# Patient Record
Sex: Female | Born: 1960 | Race: White | Hispanic: No | Smoking: Never smoker
Health system: Southern US, Community
[De-identification: ages and names within clinical notes are randomized; demographics above are authoritative.]

## PROBLEM LIST (undated history)

## (undated) DIAGNOSIS — T8859XA Other complications of anesthesia, initial encounter: Secondary | ICD-10-CM

## (undated) DIAGNOSIS — R011 Cardiac murmur, unspecified: Secondary | ICD-10-CM

## (undated) DIAGNOSIS — Z973 Presence of spectacles and contact lenses: Secondary | ICD-10-CM

## (undated) DIAGNOSIS — M1990 Unspecified osteoarthritis, unspecified site: Secondary | ICD-10-CM

## (undated) DIAGNOSIS — I341 Nonrheumatic mitral (valve) prolapse: Secondary | ICD-10-CM

## (undated) DIAGNOSIS — M419 Scoliosis, unspecified: Secondary | ICD-10-CM

## (undated) DIAGNOSIS — T4145XA Adverse effect of unspecified anesthetic, initial encounter: Secondary | ICD-10-CM

## (undated) HISTORY — PX: KNEE ARTHROSCOPY: SUR90

---

## 2006-07-19 ENCOUNTER — Emergency Department: Payer: Self-pay | Admitting: Emergency Medicine

## 2012-11-20 ENCOUNTER — Emergency Department: Payer: Self-pay | Admitting: Emergency Medicine

## 2012-12-21 ENCOUNTER — Ambulatory Visit: Payer: Self-pay | Admitting: Orthopedic Surgery

## 2014-12-26 ENCOUNTER — Encounter: Payer: Self-pay | Admitting: Emergency Medicine

## 2014-12-26 ENCOUNTER — Emergency Department: Payer: BLUE CROSS/BLUE SHIELD

## 2014-12-26 ENCOUNTER — Emergency Department
Admission: EM | Admit: 2014-12-26 | Discharge: 2014-12-26 | Disposition: A | Discharge status: Home / Self Care | Payer: BLUE CROSS/BLUE SHIELD | Attending: Emergency Medicine | Admitting: Emergency Medicine

## 2014-12-26 DIAGNOSIS — M25562 Pain in left knee: Secondary | ICD-10-CM | POA: Diagnosis present

## 2014-12-26 DIAGNOSIS — M25462 Effusion, left knee: Secondary | ICD-10-CM | POA: Diagnosis not present

## 2014-12-26 DIAGNOSIS — Z9889 Other specified postprocedural states: Secondary | ICD-10-CM | POA: Insufficient documentation

## 2014-12-26 MED ORDER — NAPROXEN 500 MG PO TABS: 500.0000 mg | ORAL_TABLET | Freq: Two times a day (BID) | ORAL | Status: DC

## 2014-12-26 MED ORDER — OXYCODONE-ACETAMINOPHEN 5-325 MG PO TABS: 1.0000 | ORAL_TABLET | Freq: Four times a day (QID) | ORAL | Status: DC | PRN

## 2014-12-26 NOTE — Discharge Instructions (Signed)

## 2014-12-26 NOTE — ED Provider Notes (Signed)
[facility] Emergency Department Provider Note  ____________________________________________  Time seen: Approximately 1844 PM  I have reviewed the triage vital signs and the nursing notes.   HISTORY  Chief Complaint Knee Pain    HPI Lisette AbuMichele T Grahn is a 54 y.o. female presents to emergency department for left knee pain after feeling a pop in the knee while running in the rain today. She denies falling she denies trauma. She reports that 10 years ago she had meniscus repair. She reports that pain in the knee is similar to that prior to her surgery.She states that initially she was able to walk however after 2 hours she was unable to bear weight.   History reviewed. No pertinent past medical history.  There are no active problems to display for this patient.   History reviewed. No pertinent past surgical history.  Current Outpatient Rx  Name  Route  Sig  Dispense  Refill  . naproxen (NAPROSYN) 500 MG tablet   Oral   Take 1 tablet (500 mg total) by mouth 2 (two) times daily with a meal.   60 tablet   0   . oxyCODONE-acetaminophen (ROXICET) 5-325 MG per tablet   Oral   Take 1 tablet by mouth every 6 (six) hours as needed.   9 tablet   0     Allergies Review of patient's allergies indicates no known allergies.  History reviewed. No pertinent family history.  Social History History  Substance Use Topics  . Smoking status: Never Smoker   . Smokeless tobacco: Not on file  . Alcohol Use: No    Review of Systems Constitutional: No fever/chills Cardiovascular: Denies chest pain. Respiratory: Denies shortness of breath. Gastrointestinal: No abdominal pain. Musculoskeletal: Negative for pain other than left knee. Skin: Negative for rash. Neurological: Negative for headaches, focal weakness or numbness.  10-point ROS otherwise negative.  ____________________________________________   PHYSICAL EXAM:  VITAL SIGNS: ED Triage Vitals   Enc Vitals Group     BP 12/26/14 1757 110/70 mmHg     Pulse Rate 12/26/14 1757 65     Resp 12/26/14 1757 21     Temp 12/26/14 1757 98.3 F (36.8 C)     Temp Source 12/26/14 1757 Oral     SpO2 12/26/14 1757 98 %     Weight 12/26/14 1757 170 lb (77.111 kg)     Height 12/26/14 1757 5\' 5"  (1.651 m)     Head Cir --      Peak Flow --      Pain Score 12/26/14 1758 4     Pain Loc --      Pain Edu? --      Excl. in GC? --     Constitutional: Alert and oriented. Well appearing and in no acute distress. Eyes: Conjunctivae are normal. PERRL. EOMI. Head: Atraumatic. Nose: No congestion/rhinnorhea. Mouth/Throat: Mucous membranes are moist.   Neck: No stridor.   Cardiovascular:   Good peripheral circulation. Respiratory: Normal respiratory effort.  No retractions.  Gastrointestinal: Soft and nontender. No distention. Musculoskeletal: Left Knee: No obvious deformity. Ligaments are stable. Pain localized outer posterior left knee. No crepitus on range of motion. Tracks midline. Neurologic:  Normal speech and language. No gross focal neurologic deficits are appreciated. Speech is normal. Skin:  Skin is warm, dry and intact. No rash noted. Psychiatric: Mood and affect are normal. Speech and behavior are normal.  ____________________________________________   LABS (all labs ordered are listed, but only abnormal results are displayed)  Labs Reviewed - No data to display ____________________________________________  EKG   ____________________________________________  RADIOLOGY  The effusion left knee. No bony abnormalities. ____________________________________________   PROCEDURES  Procedure(s) performed: knee immobilizer applied by Tech.   Critical Care performed: No  ____________________________________________   INITIAL IMPRESSION / ASSESSMENT AND PLAN / ED COURSE  Pertinent labs & imaging results that were available during my care of the patient were reviewed by me and  considered in my medical decision making (see chart for details).  Patient was advised to wear the knee immobilizer anytime she is up and moving around. She is able to bear weight with the immobilizer in place. She was advised to call and follow-up with the orthopedic doctor. ____________________________________________   FINAL CLINICAL IMPRESSION(S) / ED DIAGNOSES  Final diagnoses:  Knee joint effusion, left      Chinita PesterCari B Sudeep Scheibel, FNP 12/26/14 2302  Phineas SemenGraydon Goodman, MD 12/26/14 2328

## 2014-12-26 NOTE — ED Notes (Signed)
Patient to ED with c/o left knee pain, reports having surgery about 10 years ago. Patient reports that today she had to run to her car in the rain and felt pop and back of knee, now knee is clicking when she walks.

## 2015-01-12 ENCOUNTER — Other Ambulatory Visit: Payer: Self-pay | Admitting: Unknown Physician Specialty

## 2015-01-12 DIAGNOSIS — S83207A Unspecified tear of unspecified meniscus, current injury, left knee, initial encounter: Secondary | ICD-10-CM

## 2015-01-19 ENCOUNTER — Ambulatory Visit
Admission: RE | Admit: 2015-01-19 | Discharge: 2015-01-19 | Disposition: A | Discharge status: Home / Self Care | Payer: BLUE CROSS/BLUE SHIELD | Source: Ambulatory Visit | Attending: Unknown Physician Specialty | Admitting: Unknown Physician Specialty

## 2015-01-19 DIAGNOSIS — X58XXXA Exposure to other specified factors, initial encounter: Secondary | ICD-10-CM | POA: Insufficient documentation

## 2015-01-19 DIAGNOSIS — M1712 Unilateral primary osteoarthritis, left knee: Secondary | ICD-10-CM | POA: Insufficient documentation

## 2015-01-19 DIAGNOSIS — S83282A Other tear of lateral meniscus, current injury, left knee, initial encounter: Secondary | ICD-10-CM | POA: Insufficient documentation

## 2015-01-19 DIAGNOSIS — S83207A Unspecified tear of unspecified meniscus, current injury, left knee, initial encounter: Secondary | ICD-10-CM

## 2015-01-29 ENCOUNTER — Encounter: Payer: Self-pay | Admitting: *Deleted

## 2015-02-02 ENCOUNTER — Ambulatory Visit: Payer: BLUE CROSS/BLUE SHIELD | Admitting: Anesthesiology

## 2015-02-02 ENCOUNTER — Encounter
Admission: RE | Disposition: A | Discharge status: Home / Self Care | Payer: Self-pay | Source: Ambulatory Visit | Attending: Unknown Physician Specialty

## 2015-02-02 ENCOUNTER — Ambulatory Visit
Admission: RE | Admit: 2015-02-02 | Discharge: 2015-02-02 | Disposition: A | Discharge status: Home / Self Care | Payer: BLUE CROSS/BLUE SHIELD | Source: Ambulatory Visit | Attending: Unknown Physician Specialty | Admitting: Unknown Physician Specialty

## 2015-02-02 DIAGNOSIS — S83272A Complex tear of lateral meniscus, current injury, left knee, initial encounter: Secondary | ICD-10-CM | POA: Insufficient documentation

## 2015-02-02 DIAGNOSIS — Z79899 Other long term (current) drug therapy: Secondary | ICD-10-CM | POA: Insufficient documentation

## 2015-02-02 DIAGNOSIS — I341 Nonrheumatic mitral (valve) prolapse: Secondary | ICD-10-CM | POA: Diagnosis not present

## 2015-02-02 DIAGNOSIS — M25562 Pain in left knee: Secondary | ICD-10-CM | POA: Diagnosis present

## 2015-02-02 DIAGNOSIS — Z79891 Long term (current) use of opiate analgesic: Secondary | ICD-10-CM | POA: Diagnosis not present

## 2015-02-02 DIAGNOSIS — X58XXXA Exposure to other specified factors, initial encounter: Secondary | ICD-10-CM | POA: Insufficient documentation

## 2015-02-02 DIAGNOSIS — Y9302 Activity, running: Secondary | ICD-10-CM | POA: Diagnosis not present

## 2015-02-02 HISTORY — DX: Unspecified osteoarthritis, unspecified site: M19.90

## 2015-02-02 HISTORY — DX: Scoliosis, unspecified: M41.9

## 2015-02-02 HISTORY — DX: Adverse effect of unspecified anesthetic, initial encounter: T41.45XA

## 2015-02-02 HISTORY — DX: Other complications of anesthesia, initial encounter: T88.59XA

## 2015-02-02 HISTORY — DX: Presence of spectacles and contact lenses: Z97.3

## 2015-02-02 HISTORY — PX: KNEE ARTHROSCOPY: SHX127

## 2015-02-02 HISTORY — DX: Cardiac murmur, unspecified: R01.1

## 2015-02-02 SURGERY — ARTHROSCOPY, KNEE
Anesthesia: General | Laterality: Left | Wound class: Clean

## 2015-02-02 MED ORDER — LIDOCAINE HCL (CARDIAC) 20 MG/ML IV SOLN
INTRAVENOUS | Status: DC | PRN
  Administered 2015-02-02: 50 mg via INTRATRACHEAL

## 2015-02-02 MED ORDER — PROPOFOL 10 MG/ML IV BOLUS
INTRAVENOUS | Status: DC | PRN
  Administered 2015-02-02: 140 mg via INTRAVENOUS

## 2015-02-02 MED ORDER — ACETAMINOPHEN 160 MG/5ML PO SOLN: 325.0000 mg | ORAL | Status: DC | PRN

## 2015-02-02 MED ORDER — OXYCODONE HCL 5 MG/5ML PO SOLN: 5.0000 mg | Freq: Once | ORAL | Status: AC | PRN

## 2015-02-02 MED ORDER — BUPIVACAINE HCL (PF) 0.5 % IJ SOLN
INTRAMUSCULAR | Status: DC | PRN
  Administered 2015-02-02: 20 mL

## 2015-02-02 MED ORDER — OXYCODONE HCL 5 MG PO TABS
5.0000 mg | ORAL_TABLET | Freq: Once | ORAL | Status: AC | PRN
  Administered 2015-02-02: 5 mg via ORAL

## 2015-02-02 MED ORDER — ONDANSETRON HCL 4 MG/2ML IJ SOLN
INTRAMUSCULAR | Status: DC | PRN
  Administered 2015-02-02: 4 mg via INTRAVENOUS

## 2015-02-02 MED ORDER — LACTATED RINGERS IV SOLN
INTRAVENOUS | Status: DC
  Administered 2015-02-02: 07:00:00 via INTRAVENOUS

## 2015-02-02 MED ORDER — NORCO 5-325 MG PO TABS: 1.0000 | ORAL_TABLET | Freq: Four times a day (QID) | ORAL | Status: DC | PRN

## 2015-02-02 MED ORDER — MIDAZOLAM HCL 5 MG/5ML IJ SOLN
INTRAMUSCULAR | Status: DC | PRN
  Administered 2015-02-02: 2 mg via INTRAVENOUS

## 2015-02-02 MED ORDER — GLYCOPYRROLATE 0.2 MG/ML IJ SOLN
INTRAMUSCULAR | Status: DC | PRN
  Administered 2015-02-02: 0.1 mg via INTRAVENOUS

## 2015-02-02 MED ORDER — FENTANYL CITRATE (PF) 100 MCG/2ML IJ SOLN
INTRAMUSCULAR | Status: DC | PRN
  Administered 2015-02-02 (×2): 50 ug via INTRAVENOUS

## 2015-02-02 MED ORDER — ONDANSETRON HCL 4 MG/2ML IJ SOLN: 4.0000 mg | Freq: Once | INTRAMUSCULAR | Status: DC | PRN

## 2015-02-02 MED ORDER — LACTATED RINGERS IR SOLN
Status: DC | PRN
  Administered 2015-02-02: 2000 mL

## 2015-02-02 MED ORDER — HYDROMORPHONE HCL 1 MG/ML IJ SOLN: 0.2500 mg | INTRAMUSCULAR | Status: DC | PRN

## 2015-02-02 MED ORDER — ACETAMINOPHEN 325 MG PO TABS: 325.0000 mg | ORAL_TABLET | ORAL | Status: DC | PRN

## 2015-02-02 MED ORDER — DEXAMETHASONE SODIUM PHOSPHATE 4 MG/ML IJ SOLN
INTRAMUSCULAR | Status: DC | PRN
  Administered 2015-02-02: 4 mg via INTRAVENOUS

## 2015-02-02 SURGICAL SUPPLY — 39 items
ARTHROWAND PARAGON T2 (SURGICAL WAND)
BUR RADIUS 3.5 (BURR) ×2 IMPLANT
BUR RADIUS 4.0X18.5 (BURR) IMPLANT
BUR ROUND 5.5 (BURR) IMPLANT
BURR ROUND 12 FLUTE 4.0MM (BURR) ×2 IMPLANT
CUFF TOURN SGL QUICK 24 (TOURNIQUET CUFF) ×1
CUFF TOURN SGL QUICK 30 (MISCELLANEOUS)
CUFF TOURN SGL QUICK 34 (TOURNIQUET CUFF)
CUFF TRNQT CYL 24X4X40X1 (TOURNIQUET CUFF) ×1 IMPLANT
CUFF TRNQT CYL 34X4X40X1 (TOURNIQUET CUFF) IMPLANT
CUFF TRNQT CYL LO 30X4X (MISCELLANEOUS) IMPLANT
CUTTER SLOTTED WHISKER 4.0 (BURR) IMPLANT
DRAPE LEGGINS SURG 28X43 STRL (DRAPES) ×2 IMPLANT
DURAPREP 26ML APPLICATOR (WOUND CARE) ×2 IMPLANT
GAUZE PETROLATUM 1 X8 (GAUZE/BANDAGES/DRESSINGS) ×2 IMPLANT
GAUZE SPONGE 4X4 12PLY STRL (GAUZE/BANDAGES/DRESSINGS) ×2 IMPLANT
GLOVE BIO SURGEON STRL SZ7.5 (GLOVE) ×2 IMPLANT
GLOVE BIO SURGEON STRL SZ8 (GLOVE) ×2 IMPLANT
GLOVE INDICATOR 8.0 STRL GRN (GLOVE) ×2 IMPLANT
GOWN STRL REIN 2XL XLG LVL4 (GOWN DISPOSABLE) ×2 IMPLANT
GOWN STRL REUS W/TWL 2XL LVL3 (GOWN DISPOSABLE) ×2 IMPLANT
IV LACTATED RINGER IRRG 3000ML (IV SOLUTION) ×2
IV LR IRRIG 3000ML ARTHROMATIC (IV SOLUTION) ×2 IMPLANT
MANIFOLD 4PT FOR NEPTUNE1 (MISCELLANEOUS) ×2 IMPLANT
PACK ARTHROSCOPY KNEE (MISCELLANEOUS) ×2 IMPLANT
PAD CAST 4YDX4 CTTN HI CHSV (CAST SUPPLIES) ×1 IMPLANT
PADDING CAST COTTON 4X4 STRL (CAST SUPPLIES) ×1
SET TUBE SUCT SHAVER OUTFL 24K (TUBING) ×2 IMPLANT
SUT ETHILON 3-0 FS-10 30 BLK (SUTURE) ×2
SUTURE EHLN 3-0 FS-10 30 BLK (SUTURE) ×1 IMPLANT
TAPE CLOTH SOFT 2X10 (GAUZE/BANDAGES/DRESSINGS) ×2 IMPLANT
TAPE MICROFOAM 4IN (TAPE) ×2 IMPLANT
TUBING ARTHRO INFLOW-ONLY STRL (TUBING) ×2 IMPLANT
WAND ARTHRO PARAGON T2 (SURGICAL WAND) IMPLANT
WAND COVAC 50 IFS (MISCELLANEOUS) IMPLANT
WAND HAND CNTRL MULTIVAC 50 (MISCELLANEOUS) ×2 IMPLANT
WAND HAND CNTRL MULTIVAC 90 (MISCELLANEOUS) IMPLANT
WAND MEGAVAC 90 (MISCELLANEOUS) IMPLANT
WAND ULTRAVAC 90 (MISCELLANEOUS) IMPLANT

## 2015-02-02 NOTE — Op Note (Signed)
Preoperative diagnosis: Torn lateral meniscus left knee  Postop diagnosis: Same  Operation: Arthroscopic partial lateral meniscectomy on the left  Surgeon: Randon Goldsmith, MD  Anesthesia: Gen.   History: Patient's had a long history of left knee pain.  Her plain films revealed no significant degenerative changes . She had an MRI which revealed a torn posterior horn of the lateral meniscus along with some mild  chondral changes.The patient was scheduled for surgery due to persistent discomfort despite conservative treatment.  The patient was taken the operating room where satisfactory general anesthesia was achieved. A tourniquet and leg holder were was applied to the left thigh. The left lower extremity was supported with a well leg holder. The left knee was prepped and draped in usual fashion for an arthroscopic procedure. An inflow cannula was introduced superomedially. The joint was distended with lactated Ringer's. Scope was introduced through an inferolateral puncture wound and a probe through an inferomedial puncture wound. Inspection of the medial compartment revealed  no meniscal tear. There was mild fibrillation of the anterior weightbearing portion of the medial femoral articular cartilage. Inspection of the intercondylar notch revealed intact cruciates. Inspection of the the lateral compartment revealed a complex tear of the posterior horn of the lateral meniscus. There was also some mild fibrillation of the posterior aspect of the central weightbearing portion of the lateral femoral condyle. I went ahead and resected the posterior horn tear using a motorized resector. The remaining rim was contoured with an angled ArthroCare wand.  Trochlear groove was inspected and appeared to be fairly smooth.  Patella surface was normal. I observed patella tracking. The the patella seemed to track fairly well.  The instruments were removed from the joint at this time. The puncture wounds were  closed with 3-0 nylon in vertical mattress fashion. I injected each puncture wound with several cc of half percent Marcaine without epinephrine. Betadine was applied the wounds followed by sterile dressing. An ice pack was applied to the right knee. The patient was awakened and transferred to her stretcher bed. She was taken to the recovery room in satisfactory condition.  The tourniquet was not inflated during the course of the procedure. Blood loss was negligible.

## 2015-02-02 NOTE — Anesthesia Preprocedure Evaluation (Signed)
Anesthesia Evaluation  Patient identified by MRN, date of birth, ID band Patient awake    Reviewed: Allergy & Precautions, H&P , NPO status , Patient's Chart, lab work & pertinent test results, reviewed documented beta blocker date and time   History of Anesthesia Complications Negative for: history of anesthetic complications  Airway Mallampati: II  TM Distance: >3 FB Neck ROM: full    Dental no notable dental hx.    Pulmonary neg pulmonary ROS,  breath sounds clear to auscultation  Pulmonary exam normal       Cardiovascular Exercise Tolerance: Good + Valvular Problems/Murmurs MVP Rhythm:regular Rate:Normal     Neuro/Psych negative neurological ROS  negative psych ROS   GI/Hepatic negative GI ROS, Neg liver ROS,   Endo/Other  negative endocrine ROS  Renal/GU negative Renal ROS  negative genitourinary   Musculoskeletal   Abdominal   Peds  Hematology negative hematology ROS (+)   Anesthesia Other Findings   Reproductive/Obstetrics negative OB ROS                             Anesthesia Physical Anesthesia Plan  ASA: I  Anesthesia Plan: General LMA   Post-op Pain Management:    Induction:   Airway Management Planned:   Additional Equipment:   Intra-op Plan:   Post-operative Plan:   Informed Consent: I have reviewed the patients History and Physical, chart, labs and discussed the procedure including the risks, benefits and alternatives for the proposed anesthesia with the patient or authorized representative who has indicated his/her understanding and acceptance.     Plan Discussed with: CRNA  Anesthesia Plan Comments:         Anesthesia Quick Evaluation

## 2015-02-02 NOTE — Transfer of Care (Signed)
Immediate Anesthesia Transfer of Care Note  Patient: Karen Pacheco  Procedure(s) Performed: Procedure(s): ARTHROSCOPY KNEE (Left)  Patient Location: PACU  Anesthesia Type: General LMA  Level of Consciousness: awake, alert  and patient cooperative  Airway and Oxygen Therapy: Patient Spontanous Breathing and Patient connected to supplemental oxygen  Post-op Assessment: Post-op Vital signs reviewed, Patient's Cardiovascular Status Stable, Respiratory Function Stable, Patent Airway and No signs of Nausea or vomiting  Post-op Vital Signs: Reviewed and stable  Complications: No apparent anesthesia complications

## 2015-02-02 NOTE — Transfer of Care (Signed)
Immediate Anesthesia Transfer of Care Note  Patient: Karen Pacheco  Procedure(s) Performed: Procedure(s): ARTHROSCOPY KNEE (Left)  Patient Location: PACU  Anesthesia Type: General LMA  Level of Consciousness: awake, alert  and patient cooperative  Airway and Oxygen Therapy: Patient Spontanous Breathing and Patient connected to supplemental oxygen  Post-op Assessment: Post-op Vital signs reviewed, Patient's Cardiovascular Status Stable, Respiratory Function Stable, Patent Airway and No signs of Nausea or vomiting  Post-op Vital Signs: Reviewed and stable  Complications: No apparent anesthesia complications  

## 2015-02-02 NOTE — Anesthesia Postprocedure Evaluation (Signed)
  Anesthesia Post-op Note  Patient: Karen Pacheco  Procedure(s) Performed: Procedure(s): ARTHROSCOPY KNEE (Left)  Anesthesia type:General LMA  Patient location: PACU  Post pain: Pain level controlled  Post assessment: Post-op Vital signs reviewed, Patient's Cardiovascular Status Stable, Respiratory Function Stable, Patent Airway and No signs of Nausea or vomiting  Post vital signs: Reviewed and stable  Last Vitals:  Filed Vitals:   02/02/15 0900  BP: 118/69  Pulse: 60  Temp:   Resp: 11    Level of consciousness: awake, alert  and patient cooperative  Complications: No apparent anesthesia complications

## 2015-02-02 NOTE — H&P (Signed)
  H and P reviewed. No changes. Uploaded at later date. 

## 2015-02-02 NOTE — Anesthesia Procedure Notes (Signed)
Procedure Name: LMA Insertion Date/Time: 02/02/2015 7:39 AM Performed by: Jimmy Picket Pre-anesthesia Checklist: Patient identified, Emergency Drugs available, Suction available, Timeout performed and Patient being monitored Patient Re-evaluated:Patient Re-evaluated prior to inductionOxygen Delivery Method: Circle system utilized Preoxygenation: Pre-oxygenation with 100% oxygen Intubation Type: IV induction LMA: LMA inserted LMA Size: 4.0 Number of attempts: 1 Placement Confirmation: positive ETCO2 and breath sounds checked- equal and bilateral Tube secured with: Tape

## 2015-02-02 NOTE — Discharge Instructions (Signed)
General Anesthesia, Care After Refer to this sheet in the next few weeks. These instructions provide you with information on caring for yourself after your procedure. Your health care provider may also give you more specific instructions. Your treatment has been planned according to current medical practices, but problems sometimes occur. Call your health care provider if you have any problems or questions after your procedure. WHAT TO EXPECT AFTER THE PROCEDURE After the procedure, it is typical to experience:  Sleepiness.  Nausea and vomiting. HOME CARE INSTRUCTIONS  For the first 24 hours after general anesthesia:  Have a responsible person with you.  Do not drive a car. If you are alone, do not take public transportation.  Do not drink alcohol.  Do not take medicine that has not been prescribed by your health care provider.  Do not sign important papers or make important decisions.  You may resume a normal diet and activities as directed by your health care provider.  Change bandages (dressings) as directed.  If you have questions or problems that seem related to general anesthesia, call the hospital and ask for the anesthetist or anesthesiologist on call. SEEK MEDICAL CARE IF:  You have nausea and vomiting that continue the day after anesthesia.  You develop a rash. SEEK IMMEDIATE MEDICAL CARE IF:   You have difficulty breathing.  You have chest pain.  You have any allergic problems. Document Released: 11/03/2000 Document Revised: 08/02/2013 Document Reviewed: 02/10/2013 [facility] Patient Information 2015 [place], [place]. This information is not intended to replace advice given to you by your health care provider. Make sure you discuss any questions you have with your health care provider.   KNEE ARTHROSCOPY POST OPERATION INSTRUCTIONS:  PLEASE READ THESE INSTRUCTIONS ABOUT POST OPERATION CARE. THEY WILL ANSWER MOST OF YOUR QUESTIONS.  You have been given a  prescription for pain. Please take as directed for pain.  You can walk, keeping the knee slightly stiff-avoid doing too much bending the first day. (if ACL reconstruction is performed, keep brace locked in extension when walking.)  You will use crutches or cane if needed. Can weight bear as tolerated  Plan to take three to four days off from work. You can resume work when you are comfortable. (This can be a week or more, depending on the type of work you do.)  To reduce pain and swelling, place one to two pillows under the knee the first two or three days when sitting or lying. An ice pack may be placed on top of the area over the dressing. Instructions for making homemade icepack are as follow:  Flexible homemade alcohol water ice pack  2 cups water  1 cup rubbing alcohol  food coloring for the blue tint (optional)  2 zip-top bags - gallon-size  Mix the water and alcohol together in one of your zip-top bags and add food coloring. Release as much air as possible and seal the bag. Place in freezer for at least 12 hours.  The small incisions in your knee are closed with nylon stitches. They will be removed in the office.  The bulky dressing may be removed in the third day after surgery. (If ACL surgery-DO NOT REMOVE BANDAGES). Put a waterproof band-aid over each stitch. Do not put any creams or ointments on wounds. You may shower at this time, but change waterproof band-aids after showering. KEEP INCISIONS CLEAN AND DRY UNTIL YOU RETURN TO THE OFFICE.  Sometimes the operative area remains somewhat painful and swollen for several weeks.  This is usually nothing to worry about, but call if you have any excessive symptoms, especially fever. It is not unusual to have a low grade fever of 99 degrees for the first few days. If persist after 3-4 days call the office. It is not uncommon for the pain to be a little worse on the third day after surgery.  Begin doing gentle exercises right away. They will be limited  by the amount of pain and swelling you have.  Exercising will reduce the swelling, increase motion, and prevent muscle weakness. Exercises: Straight leg raising and gentle knee bending.  Take 81 milligram aspirin twice a day for 2 weeks after meals or milk. This along with elevation will help reduce the possibility of phlebitis in your operated leg.  Avoid strenuous athletics for a minimum of 4 to 6 weeks after arthroscopic surgery (approximately five months if ACL surgery).  If the surgery included ACL reconstruction the brace that is supplied to the extremity post surgery is to be locked in extension when you are asleep and is to be locked in extension when you are ambulating. It can be unlocked for exercises or sitting.  Keep your post surgery appointment that has been made for you. If you do not remember the date call [phone]. Your follow up appointment should be between 7-10 days.

## 2015-02-05 ENCOUNTER — Encounter: Payer: Self-pay | Admitting: Unknown Physician Specialty

## 2015-08-07 LAB — HM PAP SMEAR: HM Pap smear: NORMAL

## 2015-12-18 ENCOUNTER — Encounter: Payer: Self-pay | Admitting: Emergency Medicine

## 2015-12-18 ENCOUNTER — Emergency Department
Admission: EM | Admit: 2015-12-18 | Discharge: 2015-12-18 | Disposition: A | Discharge status: Home / Self Care | Payer: BLUE CROSS/BLUE SHIELD | Attending: Emergency Medicine | Admitting: Emergency Medicine

## 2015-12-18 DIAGNOSIS — Z8679 Personal history of other diseases of the circulatory system: Secondary | ICD-10-CM | POA: Insufficient documentation

## 2015-12-18 DIAGNOSIS — M1990 Unspecified osteoarthritis, unspecified site: Secondary | ICD-10-CM | POA: Diagnosis not present

## 2015-12-18 DIAGNOSIS — F419 Anxiety disorder, unspecified: Secondary | ICD-10-CM | POA: Insufficient documentation

## 2015-12-18 DIAGNOSIS — R0789 Other chest pain: Secondary | ICD-10-CM | POA: Diagnosis present

## 2015-12-18 DIAGNOSIS — Z791 Long term (current) use of non-steroidal anti-inflammatories (NSAID): Secondary | ICD-10-CM | POA: Diagnosis not present

## 2015-12-18 HISTORY — DX: Nonrheumatic mitral (valve) prolapse: I34.1

## 2015-12-18 LAB — CBC
HCT: 39.7 % (ref 35.0–47.0)
Hemoglobin: 13.4 g/dL (ref 12.0–16.0)
MCH: 30.5 pg (ref 26.0–34.0)
MCHC: 33.7 g/dL (ref 32.0–36.0)
MCV: 90.5 fL (ref 80.0–100.0)
Platelets: 228 10*3/uL (ref 150–440)
RBC: 4.39 MIL/uL (ref 3.80–5.20)
RDW: 13.9 % (ref 11.5–14.5)
WBC: 5.9 10*3/uL (ref 3.6–11.0)

## 2015-12-18 LAB — COMPREHENSIVE METABOLIC PANEL
ALT: 17 U/L (ref 14–54)
AST: 16 U/L (ref 15–41)
Albumin: 4.3 g/dL (ref 3.5–5.0)
Alkaline Phosphatase: 104 U/L (ref 38–126)
Anion gap: 8 (ref 5–15)
BUN: 15 mg/dL (ref 6–20)
CO2: 26 mmol/L (ref 22–32)
Calcium: 9 mg/dL (ref 8.9–10.3)
Chloride: 110 mmol/L (ref 101–111)
Creatinine, Ser: 0.81 mg/dL (ref 0.44–1.00)
GFR calc Af Amer: >60 mL/min (ref >60)
GFR calc non Af Amer: >60 mL/min (ref >60)
Glucose, Bld: 105 mg/dL — ABNORMAL HIGH (ref 65–99)
Potassium: 3.4 mmol/L — ABNORMAL LOW (ref 3.5–5.1)
Sodium: 144 mmol/L (ref 135–145)
Total Bilirubin: 0.3 mg/dL (ref 0.3–1.2)
Total Protein: 6.8 g/dL (ref 6.5–8.1)

## 2015-12-18 LAB — TROPONIN I: Troponin I: <0.03 ng/mL (ref <0.031)

## 2015-12-18 MED ORDER — HYDROXYZINE PAMOATE 25 MG PO CAPS: 25.0000 mg | ORAL_CAPSULE | Freq: Every evening | ORAL | Status: DC | PRN

## 2015-12-18 NOTE — ED Provider Notes (Signed)
[facility] Emergency Department Provider Note ____________________________________________  Time seen: Approximately 9:37 PM  I have reviewed the triage vital signs and the nursing notes.   HISTORY  Chief Complaint Chest Pain   HPI Karen Pacheco is a 55 y.o. female complaining of substernal chest pain x 1 day. Patient states it began upon waking and is a continuous discomfort that "feels like an elephant on my chest." Denies any numbness or tingling in any extremities, HA, SOB, DOE, cough, nausea, or weakness. 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 She notes that she feels so overwhelmed she cannot sleep most nights. Her husband is currently admitted at [facility] following triple bypass surgery as well.    Past Medical History  Diagnosis Date  . Complication of anesthesia     After knee surgery, felt like she couldn't breathe, SATS were fine.  Marland Kitchen Heart murmur     told in past she has MVP  . Scoliosis     mild  . Arthritis     knees  . Wears contact lenses   . Mitral valve prolapse     There are no active problems to display for this patient.   Past Surgical History  Procedure Laterality Date  . Cesarean section    . Knee arthroscopy    . Knee arthroscopy Left 02/02/2015    Procedure: ARTHROSCOPY KNEE;  Surgeon: Erin Sons, MD;  [facility];  Service: Orthopedics;  Laterality: Left;    Current Outpatient Rx  Name  Route  Sig  Dispense  Refill  . CALCIUM PO   Oral   Take by mouth.         . hydrOXYzine (VISTARIL) 25 MG capsule   Oral   Take 1 capsule (25 mg total) by mouth at bedtime as needed for anxiety.   30 capsule   0   . Multiple Vitamins-Minerals (HAIR/SKIN/NAILS PO)   Oral   Take by mouth.         . Multiple Vitamins-Minerals (MULTIVITAMIN WITH MINERALS) tablet   Oral    Take 1 tablet by mouth daily.         . naproxen (NAPROSYN) 500 MG tablet   Oral   Take 1 tablet (500 mg total) by mouth 2 (two) times daily with a meal.   60 tablet   0   . NORCO 5-325 MG per tablet   Oral   Take 1-2 tablets by mouth every 6 (six) hours as needed for moderate pain. MAXIMUM TOTAL ACETAMINOPHEN DOSE IS 4000 MG PER DAY   25 tablet   0     Dispense as written.   Marland Kitchen oxyCODONE-acetaminophen (ROXICET) 5-325 MG per tablet   Oral   Take 1 tablet by mouth every 6 (six) hours as needed.   9 tablet   0     Allergies Review of patient's allergies indicates no known allergies.  No family history on file.  Social History Social History  Substance Use Topics  . Smoking status: Never Smoker   . Smokeless tobacco: None  . Alcohol Use: No    Review of Systems Constitutional: No fever/chills Eyes: No visual changes. Cardiovascular: Admits to substernal chest pain. Respiratory: Denies shortness of breath, DOE, cough Gastrointestinal: No abdominal pain.  No nausea, no vomiting.  No diarrhea.  No constipation. Musculoskeletal: Negative for back pain, or  pain in any extremities Skin: Negative for rash. Neurological: Negative for headaches, focal weakness or numbness.  10-point ROS otherwise negative.  ____________________________________________   PHYSICAL EXAM:  VITAL SIGNS: ED Triage Vitals  Enc Vitals Group     BP 12/18/15 1906 138/82 mmHg     Pulse Rate 12/18/15 1906 65     Resp 12/18/15 1906 18     Temp 12/18/15 1906 98.2 F (36.8 C)     Temp Source 12/18/15 1906 Oral     SpO2 12/18/15 1906 100 %     Weight 12/18/15 1906 165 lb (74.844 kg)     Height 12/18/15 1906 5\' 5"  (1.651 m)     Head Cir --      Peak Flow --      Pain Score 12/18/15 1907 4     Pain Loc --      Pain Edu? --      Excl. in GC? --     Constitutional: Alert and oriented. Well appearing and in no acute distress. Eyes: Conjunctivae are normal. PERRL. EOMI. Head:  Atraumatic. Nose: No congestion/rhinnorhea. Neck: No stridor. Cardiovascular: Normal rate, regular rhythm. Grossly normal heart sounds.  Good peripheral circulation. Normal cap refill Respiratory: Normal respiratory effort.  No retractions. Lungs CTAB. Musculoskeletal: No lower extremity tenderness nor edema.  No joint effusions. Neurologic:  Normal speech and language. No gross focal neurologic deficits are appreciated. No gait instability. Skin:  Skin is warm, dry and intact. No rash noted. Psychiatric: Mood and affect are normal. Speech and behavior are normal.  ____________________________________________   LABS (all labs ordered are listed, but only abnormal results are displayed)  Labs Reviewed  COMPREHENSIVE METABOLIC PANEL - Abnormal; Notable for the following:    Potassium 3.4 (*)    Glucose, Bld 105 (*)    All other components within normal limits  CBC  TROPONIN I   ____________________________________________  EKG  Normal Sinus Rhythm  PROCEDURES  Procedure(s) performed: None  Critical Care performed: No  ____________________________________________   INITIAL IMPRESSION / ASSESSMENT AND PLAN / ED COURSE  Pertinent labs & imaging results that were available during my care of the patient were reviewed by me and considered in my medical decision making (see chart for details).  55 yo female complaining of substernal chest pain x 1 day. Diagnosis consistent with situational anxiety. Patient advised to follow-up with psychiatry or establish PCP should symptoms continue or worsen. Discharged home with hydroxyzine 25mg  take 1 capsule QHS.  ____________________________________________   FINAL CLINICAL IMPRESSION(S) / ED DIAGNOSES  Final diagnoses:  Anxiety     This chart was dictated using voice recognition software/Dragon. Despite best efforts to proofread, errors can occur which can change the meaning. Any change was purely unintentional.  Evangeline Dakinharles M Beers,  PA-C 12/18/15 2256  Phineas SemenGraydon Goodman, MD 12/18/15 2300

## 2015-12-18 NOTE — Discharge Instructions (Signed)
Generalized Anxiety Disorder Generalized anxiety disorder (GAD) is a mental disorder. It interferes with life functions, including relationships, work, and school. GAD is different from normal anxiety, which everyone experiences at some point in their lives in response to specific life events and activities. Normal anxiety actually helps us prepare for and get through these life events and activities. Normal anxiety goes away after the event or activity is over.  GAD causes anxiety that is not necessarily related to specific events or activities. It also causes excess anxiety in proportion to specific events or activities. The anxiety associated with GAD is also difficult to control. GAD can vary from mild to severe. People with severe GAD can have intense waves of anxiety with physical symptoms (panic attacks).  SYMPTOMS The anxiety and worry associated with GAD are difficult to control. This anxiety and worry are related to many life events and activities and also occur more days than not for 6 months or longer. People with GAD also have three or more of the following symptoms (one or more in children):  Restlessness.   Fatigue.  Difficulty concentrating.   Irritability.  Muscle tension.  Difficulty sleeping or unsatisfying sleep. DIAGNOSIS GAD is diagnosed through an assessment by your health care provider. Your health care provider will ask you questions aboutyour mood,physical symptoms, and events in your life. Your health care provider may ask you about your medical history and use of alcohol or drugs, including prescription medicines. Your health care provider may also do a physical exam and blood tests. Certain medical conditions and the use of certain substances can cause symptoms similar to those associated with GAD. Your health care provider may refer you to a mental health specialist for further evaluation. TREATMENT The following therapies are usually used to treat GAD:    Medication. Antidepressant medication usually is prescribed for long-term daily control. Antianxiety medicines may be added in severe cases, especially when panic attacks occur.   Talk therapy (psychotherapy). Certain types of talk therapy can be helpful in treating GAD by providing support, education, and guidance. A form of talk therapy called cognitive behavioral therapy can teach you healthy ways to think about and react to daily life events and activities.  Stress managementtechniques. These include yoga, meditation, and exercise and can be very helpful when they are practiced regularly. A mental health specialist can help determine which treatment is best for you. Some people see improvement with one therapy. However, other people require a combination of therapies.   This information is not intended to replace advice given to you by your health care provider. Make sure you discuss any questions you have with your health care provider.   Document Released: 11/22/2012 Document Revised: 08/18/2014 Document Reviewed: 11/22/2012 Elsevier Interactive Patient Education 2016 Elsevier Inc.  

## 2015-12-18 NOTE — ED Notes (Addendum)
Pt in with co midsternal chest pressure since today, denies any shob, or nausea.  No recent illness and has hx of mitral valve prolapse. Pt states she has been under a lot of stress at work.

## 2017-03-25 ENCOUNTER — Encounter: Payer: Self-pay | Admitting: Obstetrics and Gynecology

## 2017-03-25 ENCOUNTER — Ambulatory Visit (INDEPENDENT_AMBULATORY_CARE_PROVIDER_SITE_OTHER): Payer: BLUE CROSS/BLUE SHIELD | Admitting: Obstetrics and Gynecology

## 2017-03-25 ENCOUNTER — Ambulatory Visit: Payer: Self-pay | Admitting: Obstetrics & Gynecology

## 2017-03-25 DIAGNOSIS — B356 Tinea cruris: Secondary | ICD-10-CM

## 2017-03-25 MED ORDER — CLOTRIMAZOLE-BETAMETHASONE 1-0.05 % EX CREA: 1.0000 "application " | TOPICAL_CREAM | Freq: Two times a day (BID) | CUTANEOUS | 0 refills | Status: DC

## 2017-03-25 NOTE — Progress Notes (Signed)
Chief Complaint  Patient presents with  . Rash    vaginal    HPI:      Ms. Karen Pacheco is a 56 y.o. [id number] who LMP was No LMP recorded. Patient is postmenopausal., presents today for a vaginal rash for the past 3 wks. Rash is mildly itchy and uncomfortable with wiping. She has tried vaseline and cortisone-10 without relief. The rash is spreading now too. Pt is is damp underwear regularly due to being outside in the heat. She changes frequently and uses cornstarch to stay dry. No new soaps/detergents. No vaginal d/c, odor.  Past Medical History:  Diagnosis Date  . Arthritis    knees  . Complication of anesthesia    After knee surgery, felt like she couldn't breathe, SATS were fine.  Marland Kitchen Heart murmur    told in past she has MVP  . Mitral valve prolapse   . Scoliosis    mild  . Wears contact lenses     Past Surgical History:  Procedure Laterality Date  . CESAREAN SECTION    . KNEE ARTHROSCOPY    . KNEE ARTHROSCOPY Left 02/02/2015   Procedure: ARTHROSCOPY KNEE;  Surgeon: Erin Sons, MD;  Location: [facility];  Service: Orthopedics;  Laterality: Left;    Family History  Problem Relation Age of Onset  . Cancer Father     Social History   Social History  . Marital status: Married    Spouse name: N/A  . Number of children: N/A  . Years of education: N/A   Occupational History  . Not on file.   Social History Main Topics  . Smoking status: Never Smoker  . Smokeless tobacco: Never Used  . Alcohol use No  . Drug use: No  . Sexual activity: Yes   Other Topics Concern  . Not on file   Social History Narrative  . No narrative on file     Current Outpatient Prescriptions:  .  CALCIUM PO, Take by mouth., Disp: , Rfl:  .  clotrimazole-betamethasone (LOTRISONE) cream, Apply 1 application topically 2 (two) times daily. Apply externally BID prn sx up to 2 wks, Disp: 15 g, Rfl: 0 .  hydrOXYzine (VISTARIL) 25 MG capsule, Take 1 capsule (25 mg total)  by mouth at bedtime as needed for anxiety. (Patient not taking: Reported on 03/25/2017), Disp: 30 capsule, Rfl: 0 .  Multiple Vitamins-Minerals (HAIR/SKIN/NAILS PO), Take by mouth., Disp: , Rfl:  .  Multiple Vitamins-Minerals (MULTIVITAMIN WITH MINERALS) tablet, Take 1 tablet by mouth daily., Disp: , Rfl:  .  naproxen (NAPROSYN) 500 MG tablet, Take 1 tablet (500 mg total) by mouth 2 (two) times daily with a meal. (Patient not taking: Reported on 03/25/2017), Disp: 60 tablet, Rfl: 0 .  NORCO 5-325 MG per tablet, Take 1-2 tablets by mouth every 6 (six) hours as needed for moderate pain. MAXIMUM TOTAL ACETAMINOPHEN DOSE IS 4000 MG PER DAY (Patient not taking: Reported on 03/25/2017), Disp: 25 tablet, Rfl: 0 .  oxyCODONE-acetaminophen (ROXICET) 5-325 MG per tablet, Take 1 tablet by mouth every 6 (six) hours as needed. (Patient not taking: Reported on 03/25/2017), Disp: 9 tablet, Rfl: 0   ROS:  Review of Systems  Constitutional: Negative for fever.  Gastrointestinal: Negative for blood in stool, constipation, diarrhea, nausea and vomiting.  Genitourinary: Negative for dyspareunia, dysuria, flank pain, frequency, hematuria, urgency, vaginal bleeding, vaginal discharge and vaginal pain.  Musculoskeletal: Negative for back pain.  Skin: Positive for rash.     OBJECTIVE:  Vitals:  BP 100/70   Pulse (!) 58   Ht 5\' 5"  (1.651 m)   Wt 169 lb (76.7 kg)   BMI 28.12 kg/m   Physical Exam  Constitutional: She is oriented to person, place, and time and well-developed, well-nourished, and in no distress.  Genitourinary: Vulva exhibits erythema, lesion and rash. Vulva exhibits no exudate and no tenderness.  Genitourinary Comments: LT LABIA MAJORA/PERINEAL/PERIANAL AREAS WITH FEW RED PAPULES, WITH SCALE; NO D/C, ULCERS  Neurological: She is alert and oriented to person, place, and time.  Psychiatric: Memory, affect and judgment normal.  Vitals reviewed.   Assessment/Plan: Tinea cruris - Rx  clotrimazole/betamthasone crm. Can dry with hairdryer, use desitin as moisture block. F/u prn.  - Plan: clotrimazole-betamethasone (LOTRISONE) cream    Meds ordered this encounter  Medications  . clotrimazole-betamethasone (LOTRISONE) cream    Sig: Apply 1 application topically 2 (two) times daily. Apply externally BID prn sx up to 2 wks    Dispense:  15 g    Refill:  0      Return if symptoms worsen or fail to improve.  Aneri Slagel B. Regina Coppolino, PA-C 03/25/2017 10:02 AM

## 2017-03-30 ENCOUNTER — Ambulatory Visit: Payer: Self-pay | Admitting: Obstetrics and Gynecology

## 2017-04-06 ENCOUNTER — Ambulatory Visit: Payer: Self-pay | Admitting: Obstetrics & Gynecology

## 2017-04-07 DIAGNOSIS — L255 Unspecified contact dermatitis due to plants, except food: Secondary | ICD-10-CM | POA: Diagnosis not present

## 2017-05-14 DIAGNOSIS — H1032 Unspecified acute conjunctivitis, left eye: Secondary | ICD-10-CM | POA: Diagnosis not present

## 2017-05-19 DIAGNOSIS — H02829 Cysts of unspecified eye, unspecified eyelid: Secondary | ICD-10-CM | POA: Diagnosis not present

## 2017-05-19 DIAGNOSIS — H1032 Unspecified acute conjunctivitis, left eye: Secondary | ICD-10-CM | POA: Diagnosis not present

## 2017-06-02 DIAGNOSIS — H1032 Unspecified acute conjunctivitis, left eye: Secondary | ICD-10-CM | POA: Diagnosis not present

## 2017-06-29 ENCOUNTER — Ambulatory Visit (INDEPENDENT_AMBULATORY_CARE_PROVIDER_SITE_OTHER): Payer: BLUE CROSS/BLUE SHIELD | Admitting: Obstetrics and Gynecology

## 2017-06-29 ENCOUNTER — Encounter: Payer: Self-pay | Admitting: Obstetrics and Gynecology

## 2017-06-29 VITALS — BP 124/74 | Ht 65.0 in | Wt 174.0 lb

## 2017-06-29 DIAGNOSIS — R1030 Lower abdominal pain, unspecified: Secondary | ICD-10-CM

## 2017-06-29 NOTE — Progress Notes (Signed)
Obstetrics & Gynecology Office Visit   Chief Complaint  Patient presents with  . Pelvic Pain   History of Present Illness: 56 y.o. 752P2002 female who notes that over the past 2-3 weeks she has had lower abdominal pressure and fullness, right worse than left.  The discomfort is moderate.  The discomfort is described as pressure and does not radiate.  Her putting some sort of pressure on the area makes it better.  Nothing makes it worse.  She has had a new onset of night sweats and hot flushes (for the past several months). She has noted a decreased appetite, bloating, and fatigue.  She denies vaginal bleeding, Denies constipation, diarrhea, dysuria, vaginal discharge. Denies actual pain. She has had no fevers or weight changes.   Past Medical History:  Diagnosis Date  . Arthritis    knees  . Complication of anesthesia    After knee surgery, felt like she couldn't breathe, SATS were fine.  Marland Kitchen. Heart murmur    told in past she has MVP  . Mitral valve prolapse   . Scoliosis    mild  . Wears contact lenses     Past Surgical History:  Procedure Laterality Date  . ARTHROSCOPY KNEE Left 02/02/2015   Performed by Erin SonsKernodle, Harold, [facility]  . CESAREAN SECTION    . KNEE ARTHROSCOPY      Gynecologic History: No LMP recorded. Patient is postmenopausal.  Obstetric History: [id number]  Family History  Problem Relation Age of Onset  . Cancer Father     Social History   Socioeconomic History  . Marital status: Married    Spouse name: Not on file  . Number of children: Not on file  . Years of education: Not on file  . Highest education level: Not on file  Social Needs  . Financial resource strain: Not on file  . Food insecurity - worry: Not on file  . Food insecurity - inability: Not on file  . Transportation needs - medical: Not on file  . Transportation needs - non-medical: Not on file  Occupational History  . Not on file  Tobacco Use  . Smoking status: Never  Smoker  . Smokeless tobacco: Never Used  Substance and Sexual Activity  . Alcohol use: No  . Drug use: No  . Sexual activity: Yes  Other Topics Concern  . Not on file  Social History Narrative  . Not on file   Allergies: No Known Allergies  Medications   Medication Sig Start Date End Date Taking? Authorizing Provider  Multiple Vitamins-Minerals (MULTIVITAMIN WITH MINERALS) tablet Take 1 tablet by mouth daily.   Yes [provider]  CALCIUM PO Take by mouth.    [provider]   Review of Systems  Constitutional: Positive for diaphoresis and malaise/fatigue. Negative for chills, fever and weight loss.  HENT: Negative.   Eyes: Negative.   Respiratory: Negative.   Cardiovascular: Negative.   Gastrointestinal: Positive for abdominal pain (discomfort. Denies pain) and nausea. Negative for blood in stool, constipation, diarrhea, heartburn, melena and vomiting.  Genitourinary: Negative.   Musculoskeletal: Negative.   Skin: Negative.   Neurological: Positive for weakness. Negative for dizziness, tingling, tremors, sensory change, speech change and headaches.  Endo/Heme/Allergies: Negative.   Psychiatric/Behavioral: Negative.      Physical Exam BP 124/74   Ht 5\' 5"  (1.651 m)   Wt 174 lb (78.9 kg)   BMI 28.96 kg/m  No LMP recorded. Patient is postmenopausal. Physical Exam  Constitutional:  She is oriented to person, place, and time. She appears well-developed and well-nourished. No distress.  Genitourinary: Vagina normal and uterus normal. Pelvic exam was performed with patient supine. There is no rash, tenderness, lesion or injury on the right labia. There is no rash, tenderness, lesion or injury on the left labia. Right adnexum does not display mass, does not display tenderness and does not display fullness.  Left adnexum displays fullness. Left adnexum does not display mass and does not display tenderness. Cervix does not exhibit motion tenderness, lesion,  discharge or polyp.  HENT:  Head: Normocephalic and atraumatic.  Eyes: EOM are normal. No scleral icterus.  Neck: Normal range of motion. Neck supple.  Cardiovascular: Normal rate and regular rhythm.  Pulmonary/Chest: Effort normal and breath sounds normal. No respiratory distress. She has no wheezes. She has no rales.  Abdominal: Soft. Bowel sounds are normal. She exhibits no distension and no mass. There is no tenderness. There is no rebound and no guarding.  Musculoskeletal: Normal range of motion. She exhibits no edema.  Neurological: She is alert and oriented to person, place, and time. No cranial nerve deficit.  Skin: Skin is warm and dry. No erythema.  Psychiatric: She has a normal mood and affect. Her behavior is normal. Judgment normal.   Female chaperone present for pelvic and breast  portions of the physical exam  Assessment: 56 y.o. 722P2002 female here for  1. Lower abdominal pain      Plan: Problem List Items Addressed This Visit    None    Visit Diagnoses    Lower abdominal pain    -  Primary   Relevant Orders   US PELVIS TRANSVANGINAL NON-OB (TV ONLY)   GC/Chlamydia Probe Amp     New problem with potentially serious etiology.  Will obtain ultrasound to assess for ovarian neoplasm or growth.  Will obtain STD check to assess cause for discomfort.   Discussed multiple potential etiologies for a vague set of symptoms.  May need to look in other organ systems, if ultrasound is normal.   Thomasene MohairStephen Dreyah Montrose, MD 06/29/2017 5:44 PM

## 2017-06-30 DIAGNOSIS — R1030 Lower abdominal pain, unspecified: Secondary | ICD-10-CM | POA: Diagnosis not present

## 2017-07-01 ENCOUNTER — Ambulatory Visit (INDEPENDENT_AMBULATORY_CARE_PROVIDER_SITE_OTHER): Payer: BLUE CROSS/BLUE SHIELD

## 2017-07-01 DIAGNOSIS — R1030 Lower abdominal pain, unspecified: Secondary | ICD-10-CM

## 2017-07-02 LAB — GC/CHLAMYDIA PROBE AMP
Chlamydia trachomatis, NAA: NEGATIVE
Neisseria gonorrhoeae by PCR: NEGATIVE

## 2017-07-07 ENCOUNTER — Telehealth: Payer: Self-pay

## 2017-07-07 NOTE — Telephone Encounter (Signed)
Would you mind letting the patient know that the ultrasound was normal?  I did not see any evidence of cysts or any abnormal structures that should not be there.  We can discuss further at her follow-up visit.   Thank you!

## 2017-07-07 NOTE — Telephone Encounter (Signed)
please advise.

## 2017-07-07 NOTE — Telephone Encounter (Signed)
Pt aware.

## 2017-07-07 NOTE — Telephone Encounter (Signed)
Pt had U/S last week for lower Abd pain. She has AE w/[facility] on 07/15/17 which is when he was going to review results w/her. She is wondering if he can review U/S & let her know the results instead of her having to wait as she is concerned. Cb#[id number].

## 2017-07-15 ENCOUNTER — Other Ambulatory Visit: Payer: BLUE CROSS/BLUE SHIELD

## 2017-07-15 ENCOUNTER — Encounter: Payer: Self-pay | Admitting: Obstetrics and Gynecology

## 2017-07-15 ENCOUNTER — Other Ambulatory Visit: Payer: Self-pay | Admitting: Obstetrics and Gynecology

## 2017-07-15 ENCOUNTER — Ambulatory Visit (INDEPENDENT_AMBULATORY_CARE_PROVIDER_SITE_OTHER): Payer: BLUE CROSS/BLUE SHIELD | Admitting: Obstetrics and Gynecology

## 2017-07-15 VITALS — BP 118/76 | Ht 65.0 in | Wt 172.0 lb

## 2017-07-15 DIAGNOSIS — Z13220 Encounter for screening for lipoid disorders: Secondary | ICD-10-CM

## 2017-07-15 DIAGNOSIS — Z1211 Encounter for screening for malignant neoplasm of colon: Secondary | ICD-10-CM | POA: Diagnosis not present

## 2017-07-15 DIAGNOSIS — Z131 Encounter for screening for diabetes mellitus: Secondary | ICD-10-CM

## 2017-07-15 DIAGNOSIS — Z1231 Encounter for screening mammogram for malignant neoplasm of breast: Secondary | ICD-10-CM

## 2017-07-15 DIAGNOSIS — Z1321 Encounter for screening for nutritional disorder: Secondary | ICD-10-CM

## 2017-07-15 DIAGNOSIS — Z1329 Encounter for screening for other suspected endocrine disorder: Secondary | ICD-10-CM | POA: Diagnosis not present

## 2017-07-15 DIAGNOSIS — Z1331 Encounter for screening for depression: Secondary | ICD-10-CM | POA: Diagnosis not present

## 2017-07-15 DIAGNOSIS — Z01419 Encounter for gynecological examination (general) (routine) without abnormal findings: Secondary | ICD-10-CM | POA: Diagnosis not present

## 2017-07-15 DIAGNOSIS — Z1339 Encounter for screening examination for other mental health and behavioral disorders: Secondary | ICD-10-CM | POA: Diagnosis not present

## 2017-07-15 DIAGNOSIS — Z0000 Encounter for general adult medical examination without abnormal findings: Secondary | ICD-10-CM

## 2017-07-15 NOTE — Progress Notes (Signed)
Routine Annual Gynecology Examination   PCP: Patient, No Pcp Per  Chief Complaint  Patient presents with  . Follow-up    pelvic pain, right lower quadrant  . Annual Exam    History of Present Illness: Patient is an established 56 y.o. G2P2002 presents for annual exam. The patient has no complaints today. She had a recent ultrasound that was normal.    Menopausal bleeding: denies  Menopausal symptoms: denies  Breast symptoms: denies  Last pap smear: 2 years ago.  Result Normal  Last mammogram: 2 years ago.  Result Normal  Past Medical History:  Diagnosis Date  . Arthritis    knees  . Complication of anesthesia    After knee surgery, felt like she couldn't breathe, SATS were fine.  Marland Kitchen Heart murmur    told in past she has MVP  . Mitral valve prolapse   . Scoliosis    mild  . Wears contact lenses     Past Surgical History:  Procedure Laterality Date  . CESAREAN SECTION    . KNEE ARTHROSCOPY    . KNEE ARTHROSCOPY Left 02/02/2015   Procedure: ARTHROSCOPY KNEE;  Surgeon: Erin Sons, MD;  Location: [facility];  Service: Orthopedics;  Laterality: Left;    Prior to Admission medications   Medication Sig Start Date End Date Taking? Authorizing Provider  CALCIUM PO Take by mouth.    [provider]  Multiple Vitamins-Minerals (HAIR/SKIN/NAILS PO) Take by mouth.    [provider]  Multiple Vitamins-Minerals (MULTIVITAMIN WITH MINERALS) tablet Take 1 tablet by mouth daily.    [provider]   Allergies:No Known Allergies  Obstetric History: [id number]  Social History   Socioeconomic History  . Marital status: Married    Spouse name: Not on file  . Number of children: Not on file  . Years of education: Not on file  . Highest education level: Not on file  Social Needs  . Financial resource strain: Not on file  . Food insecurity - worry: Not on file  . Food insecurity - inability: Not on file  . Transportation needs -  medical: Not on file  . Transportation needs - non-medical: Not on file  Occupational History  . Not on file  Tobacco Use  . Smoking status: Never Smoker  . Smokeless tobacco: Never Used  Substance and Sexual Activity  . Alcohol use: No  . Drug use: No  . Sexual activity: Yes  Other Topics Concern  . Not on file  Social History Narrative  . Not on file    Family History  Problem Relation Age of Onset  . Cancer Father     Review of Systems  Constitutional: Negative.   HENT: Negative.   Eyes: Negative.   Respiratory: Negative.   Cardiovascular: Negative.   Gastrointestinal: Negative.   Genitourinary: Negative.   Musculoskeletal: Negative.   Skin: Negative.   Neurological: Negative.   Psychiatric/Behavioral: Negative.      Physical Exam Vitals: BP 118/76   Ht 5\' 5"  (1.651 m)   Wt 172 lb (78 kg)   BMI 28.62 kg/m   Physical Exam  Constitutional: She is oriented to person, place, and time. She appears well-developed and well-nourished. No distress.  Genitourinary: Uterus normal. Pelvic exam was performed with patient supine. There is no rash, tenderness, lesion or injury on the right labia. There is no rash, tenderness, lesion or injury on the left labia. No erythema, tenderness or bleeding in the vagina. No signs of injury  around the vagina. No vaginal discharge found. Right adnexum does not display mass, does not display tenderness and does not display fullness. Left adnexum does not display mass, does not display tenderness and does not display fullness. Cervix does not exhibit motion tenderness, lesion, discharge or polyp.   Uterus is mobile and anteverted. Uterus is not enlarged, tender or exhibiting a mass.  HENT:  Head: Normocephalic and atraumatic.  Eyes: EOM are normal. No scleral icterus.  Neck: Normal range of motion. Neck supple. No thyromegaly present.  Cardiovascular: Normal rate and regular rhythm. Exam reveals no gallop and no friction rub.  No murmur  heard. Pulmonary/Chest: Effort normal and breath sounds normal. No respiratory distress. She has no wheezes. She has no rales. Right breast exhibits no inverted nipple, no mass, no nipple discharge, no skin change and no tenderness. Left breast exhibits no inverted nipple, no mass, no nipple discharge, no skin change and no tenderness.  Abdominal: Soft. Bowel sounds are normal. She exhibits no distension and no mass. There is no tenderness. There is no rebound and no guarding.  Musculoskeletal: Normal range of motion. She exhibits no edema or tenderness.  Lymphadenopathy:    She has no cervical adenopathy.       Right: No inguinal adenopathy present.       Left: No inguinal adenopathy present.  Neurological: She is alert and oriented to person, place, and time. No cranial nerve deficit.  Skin: Skin is warm and dry. No rash noted. No erythema.  Psychiatric: She has a normal mood and affect. Her behavior is normal. Judgment normal.    Female chaperone present for pelvic and breast  portions of the physical exam  Results: Screen alcohol (AUDIT): negative Screen depression (PHQ-9): negative  Assessment and Plan:  56 y.o. 582P2002 female here for routine annual gynecologic examination  Plan: Problem List Items Addressed This Visit    None    Visit Diagnoses    Women's annual routine gynecological examination    -  Primary   Relevant Orders   TSH   VITAMIN D 25 Hydroxy (Vit-D Deficiency, Fractures)   Hgb A1c w/o eAG   Lipid Panel With LDL/HDL Ratio   Comprehensive metabolic panel   CBC   Ambulatory referral to Gastroenterology   Screening for depression       Screening for alcohol problem       Screening cholesterol level       Relevant Orders   Lipid Panel With LDL/HDL Ratio   Screening for diabetes mellitus       Relevant Orders   Hgb A1c w/o eAG   Screening for thyroid disorder       Laboratory examination ordered as part of a routine general medical examination        Relevant Orders   Comprehensive metabolic panel   CBC   Encounter for vitamin deficiency screening       Relevant Orders   VITAMIN D 25 Hydroxy (Vit-D Deficiency, Fractures)   Screen for colon cancer       Relevant Orders   Ambulatory referral to Gastroenterology      Screening: -- Blood pressure screen normal -- Colonoscopy - due - will schedule -- Mammogram - due. Patient to call [place] to arrange. She understands that it is her responsibility to arrange this. -- Weight screening: normal -- Depression screening negative (PHQ-9) -- Nutrition: normal -- cholesterol screening: will obtain -- osteoporosis screening: not due -- tobacco screening: not using -- alcohol screening: AUDIT  questionnaire indicates low-risk usage. -- family history of breast cancer screening: done. not at high risk. -- no evidence of domestic violence or intimate partner violence. -- STD screening: gonorrhea/chlamydia NAAT not collected per patient request. -- pap smear not collected per ASCCP guidelines -- flu vaccine declined -- HPV vaccination series: not eligilbe   Thomasene MohairStephen Madline Oesterling, MD 07/15/2017 7:16 PM

## 2017-07-16 LAB — COMPREHENSIVE METABOLIC PANEL
ALT: 12 IU/L (ref 0–32)
AST: 19 IU/L (ref 0–40)
Albumin/Globulin Ratio: 2 (ref 1.2–2.2)
Albumin: 4.6 g/dL (ref 3.5–5.5)
Alkaline Phosphatase: 125 IU/L — ABNORMAL HIGH (ref 39–117)
BUN/Creatinine Ratio: 19 (ref 9–23)
BUN: 14 mg/dL (ref 6–24)
Bilirubin Total: 0.4 mg/dL (ref 0.0–1.2)
CO2: 24 mmol/L (ref 20–29)
Calcium: 9.6 mg/dL (ref 8.7–10.2)
Chloride: 103 mmol/L (ref 96–106)
Creatinine, Ser: 0.74 mg/dL (ref 0.57–1.00)
GFR calc Af Amer: 106 mL/min/{1.73_m2} (ref >59)
GFR calc non Af Amer: 92 mL/min/{1.73_m2} (ref >59)
Globulin, Total: 2.3 g/dL (ref 1.5–4.5)
Glucose: 88 mg/dL (ref 65–99)
Potassium: 3.9 mmol/L (ref 3.5–5.2)
Sodium: 142 mmol/L (ref 134–144)
Total Protein: 6.9 g/dL (ref 6.0–8.5)

## 2017-07-16 LAB — CBC
Hematocrit: 40.5 % (ref 34.0–46.6)
Hemoglobin: 13.7 g/dL (ref 11.1–15.9)
MCH: 29.8 pg (ref 26.6–33.0)
MCHC: 33.8 g/dL (ref 31.5–35.7)
MCV: 88 fL (ref 79–97)
Platelets: 271 10*3/uL (ref 150–379)
RBC: 4.6 x10E6/uL (ref 3.77–5.28)
RDW: 13.9 % (ref 12.3–15.4)
WBC: 4.6 10*3/uL (ref 3.4–10.8)

## 2017-07-16 LAB — HGB A1C W/O EAG: Hgb A1c MFr Bld: 5.4 % (ref 4.8–5.6)

## 2017-07-16 LAB — LIPID PANEL WITH LDL/HDL RATIO
Cholesterol, Total: 227 mg/dL — ABNORMAL HIGH (ref 100–199)
HDL: 78 mg/dL (ref >39)
LDL Calculated: 133 mg/dL — ABNORMAL HIGH (ref 0–99)
LDl/HDL Ratio: 1.7 ratio (ref 0.0–3.2)
Triglycerides: 79 mg/dL (ref 0–149)
VLDL Cholesterol Cal: 16 mg/dL (ref 5–40)

## 2017-07-16 LAB — TSH: TSH: 0.975 u[IU]/mL (ref 0.450–4.500)

## 2017-07-16 LAB — VITAMIN D 25 HYDROXY (VIT D DEFICIENCY, FRACTURES): Vit D, 25-Hydroxy: 41.2 ng/mL (ref 30.0–100.0)

## 2017-08-12 ENCOUNTER — Ambulatory Visit
Admission: RE | Admit: 2017-08-12 | Discharge: 2017-08-12 | Disposition: A | Discharge status: Home / Self Care | Payer: BLUE CROSS/BLUE SHIELD | Source: Ambulatory Visit | Attending: Obstetrics and Gynecology | Admitting: Obstetrics and Gynecology

## 2017-08-12 DIAGNOSIS — Z1231 Encounter for screening mammogram for malignant neoplasm of breast: Secondary | ICD-10-CM | POA: Diagnosis not present

## 2017-08-19 ENCOUNTER — Other Ambulatory Visit: Payer: Self-pay | Admitting: *Deleted

## 2017-08-19 ENCOUNTER — Inpatient Hospital Stay
Admission: RE | Admit: 2017-08-19 | Discharge: 2017-08-19 | Disposition: A | Discharge status: Home / Self Care | Payer: Self-pay | Source: Ambulatory Visit | Attending: *Deleted | Admitting: *Deleted

## 2017-08-19 DIAGNOSIS — Z9289 Personal history of other medical treatment: Secondary | ICD-10-CM

## 2017-08-21 ENCOUNTER — Encounter: Payer: Self-pay | Admitting: *Deleted

## 2017-10-05 DIAGNOSIS — M7542 Impingement syndrome of left shoulder: Secondary | ICD-10-CM | POA: Diagnosis not present

## 2017-10-05 DIAGNOSIS — M7541 Impingement syndrome of right shoulder: Secondary | ICD-10-CM | POA: Diagnosis not present

## 2017-10-08 DIAGNOSIS — M25612 Stiffness of left shoulder, not elsewhere classified: Secondary | ICD-10-CM | POA: Diagnosis not present

## 2017-10-08 DIAGNOSIS — M25511 Pain in right shoulder: Secondary | ICD-10-CM | POA: Diagnosis not present

## 2017-10-08 DIAGNOSIS — M25512 Pain in left shoulder: Secondary | ICD-10-CM | POA: Diagnosis not present

## 2017-10-16 DIAGNOSIS — L820 Inflamed seborrheic keratosis: Secondary | ICD-10-CM | POA: Diagnosis not present

## 2017-10-20 DIAGNOSIS — M25511 Pain in right shoulder: Secondary | ICD-10-CM | POA: Diagnosis not present

## 2017-10-20 DIAGNOSIS — R609 Edema, unspecified: Secondary | ICD-10-CM | POA: Diagnosis not present

## 2017-10-20 DIAGNOSIS — M25512 Pain in left shoulder: Secondary | ICD-10-CM | POA: Diagnosis not present

## 2017-10-20 DIAGNOSIS — M25612 Stiffness of left shoulder, not elsewhere classified: Secondary | ICD-10-CM | POA: Diagnosis not present

## 2017-10-22 DIAGNOSIS — M25511 Pain in right shoulder: Secondary | ICD-10-CM | POA: Diagnosis not present

## 2017-10-22 DIAGNOSIS — M25612 Stiffness of left shoulder, not elsewhere classified: Secondary | ICD-10-CM | POA: Diagnosis not present

## 2017-10-22 DIAGNOSIS — R609 Edema, unspecified: Secondary | ICD-10-CM | POA: Diagnosis not present

## 2017-10-22 DIAGNOSIS — M25512 Pain in left shoulder: Secondary | ICD-10-CM | POA: Diagnosis not present

## 2017-10-26 DIAGNOSIS — M25612 Stiffness of left shoulder, not elsewhere classified: Secondary | ICD-10-CM | POA: Diagnosis not present

## 2017-10-26 DIAGNOSIS — R609 Edema, unspecified: Secondary | ICD-10-CM | POA: Diagnosis not present

## 2017-10-26 DIAGNOSIS — M25511 Pain in right shoulder: Secondary | ICD-10-CM | POA: Diagnosis not present

## 2017-10-26 DIAGNOSIS — M25512 Pain in left shoulder: Secondary | ICD-10-CM | POA: Diagnosis not present

## 2017-10-29 DIAGNOSIS — M25511 Pain in right shoulder: Secondary | ICD-10-CM | POA: Diagnosis not present

## 2017-10-29 DIAGNOSIS — M25612 Stiffness of left shoulder, not elsewhere classified: Secondary | ICD-10-CM | POA: Diagnosis not present

## 2017-10-29 DIAGNOSIS — M25512 Pain in left shoulder: Secondary | ICD-10-CM | POA: Diagnosis not present

## 2017-10-29 DIAGNOSIS — R609 Edema, unspecified: Secondary | ICD-10-CM | POA: Diagnosis not present

## 2017-10-30 DIAGNOSIS — K210 Gastro-esophageal reflux disease with esophagitis: Secondary | ICD-10-CM | POA: Diagnosis not present

## 2017-11-02 DIAGNOSIS — R609 Edema, unspecified: Secondary | ICD-10-CM | POA: Diagnosis not present

## 2017-11-02 DIAGNOSIS — M25511 Pain in right shoulder: Secondary | ICD-10-CM | POA: Diagnosis not present

## 2017-11-02 DIAGNOSIS — M25612 Stiffness of left shoulder, not elsewhere classified: Secondary | ICD-10-CM | POA: Diagnosis not present

## 2017-11-02 DIAGNOSIS — M25512 Pain in left shoulder: Secondary | ICD-10-CM | POA: Diagnosis not present

## 2017-11-06 DIAGNOSIS — M25511 Pain in right shoulder: Secondary | ICD-10-CM | POA: Diagnosis not present

## 2017-11-09 ENCOUNTER — Other Ambulatory Visit: Payer: Self-pay | Admitting: Orthopedic Surgery

## 2017-11-09 DIAGNOSIS — M25511 Pain in right shoulder: Secondary | ICD-10-CM

## 2017-11-09 DIAGNOSIS — M25512 Pain in left shoulder: Secondary | ICD-10-CM

## 2017-11-20 ENCOUNTER — Ambulatory Visit
Admission: RE | Admit: 2017-11-20 | Discharge: 2017-11-20 | Disposition: A | Discharge status: Home / Self Care | Payer: BLUE CROSS/BLUE SHIELD | Source: Ambulatory Visit | Attending: Orthopedic Surgery | Admitting: Orthopedic Surgery

## 2017-11-20 ENCOUNTER — Other Ambulatory Visit: Payer: Self-pay | Admitting: Orthopedic Surgery

## 2017-11-20 DIAGNOSIS — M75102 Unspecified rotator cuff tear or rupture of left shoulder, not specified as traumatic: Secondary | ICD-10-CM | POA: Diagnosis not present

## 2017-11-20 DIAGNOSIS — M25512 Pain in left shoulder: Secondary | ICD-10-CM

## 2017-11-20 DIAGNOSIS — M25511 Pain in right shoulder: Secondary | ICD-10-CM

## 2017-11-20 MED ORDER — SODIUM CHLORIDE 0.9 % IJ SOLN
10.0000 mL | Freq: Once | INTRAMUSCULAR | Status: AC
  Administered 2017-11-20: 10 mL

## 2017-11-20 MED ORDER — GADOBENATE DIMEGLUMINE 529 MG/ML IV SOLN
0.0100 mL | Freq: Once | INTRAVENOUS | Status: AC | PRN
  Administered 2017-11-20: 0.01 mL via INTRA_ARTICULAR

## 2017-11-20 MED ORDER — LIDOCAINE HCL (PF) 1 % IJ SOLN
5.0000 mL | Freq: Once | INTRAMUSCULAR | Status: AC
  Administered 2017-11-20: 5 mL
  Filled 2017-11-20: qty 5

## 2017-11-20 MED ORDER — IOPAMIDOL (ISOVUE-200) INJECTION 41%
15.0000 mL | Freq: Once | INTRAVENOUS | Status: AC | PRN
  Administered 2017-11-20: 15 mL
  Filled 2017-11-20: qty 50

## 2017-11-24 DIAGNOSIS — S43432A Superior glenoid labrum lesion of left shoulder, initial encounter: Secondary | ICD-10-CM | POA: Diagnosis not present

## 2017-11-24 DIAGNOSIS — M25511 Pain in right shoulder: Secondary | ICD-10-CM | POA: Diagnosis not present

## 2017-12-17 ENCOUNTER — Other Ambulatory Visit: Payer: Self-pay | Admitting: Orthopedic Surgery

## 2017-12-17 DIAGNOSIS — M25511 Pain in right shoulder: Secondary | ICD-10-CM

## 2017-12-28 ENCOUNTER — Ambulatory Visit
Admission: RE | Admit: 2017-12-28 | Discharge: 2017-12-28 | Disposition: A | Discharge status: Home / Self Care | Payer: BLUE CROSS/BLUE SHIELD | Source: Ambulatory Visit | Attending: Orthopedic Surgery | Admitting: Orthopedic Surgery

## 2017-12-28 DIAGNOSIS — Q740 Other congenital malformations of upper limb(s), including shoulder girdle: Secondary | ICD-10-CM | POA: Diagnosis not present

## 2017-12-28 DIAGNOSIS — M75121 Complete rotator cuff tear or rupture of right shoulder, not specified as traumatic: Secondary | ICD-10-CM | POA: Diagnosis not present

## 2017-12-28 DIAGNOSIS — M19011 Primary osteoarthritis, right shoulder: Secondary | ICD-10-CM | POA: Insufficient documentation

## 2017-12-28 DIAGNOSIS — M25511 Pain in right shoulder: Secondary | ICD-10-CM

## 2017-12-28 MED ORDER — GADOBENATE DIMEGLUMINE 529 MG/ML IV SOLN
0.1000 mL | Freq: Once | INTRAVENOUS | Status: AC | PRN
  Administered 2017-12-28: 0.1 mL via INTRA_ARTICULAR

## 2017-12-28 MED ORDER — LIDOCAINE HCL (PF) 1 % IJ SOLN
10.0000 mL | Freq: Once | INTRAMUSCULAR | Status: AC
  Administered 2017-12-28: 10 mL
  Filled 2017-12-28: qty 10

## 2017-12-28 MED ORDER — IOPAMIDOL (ISOVUE-200) INJECTION 41%
15.0000 mg | Freq: Once | INTRAVENOUS | Status: AC | PRN
  Administered 2017-12-28: 0.04 mL
  Filled 2017-12-28: qty 50

## 2017-12-30 DIAGNOSIS — S43431A Superior glenoid labrum lesion of right shoulder, initial encounter: Secondary | ICD-10-CM | POA: Diagnosis not present

## 2017-12-30 DIAGNOSIS — M75101 Unspecified rotator cuff tear or rupture of right shoulder, not specified as traumatic: Secondary | ICD-10-CM | POA: Diagnosis not present

## 2018-01-05 ENCOUNTER — Telehealth: Payer: Self-pay | Admitting: Obstetrics and Gynecology

## 2018-01-05 NOTE — Telephone Encounter (Signed)
-----   Message from [place], Generic sent at 01/01/2018 2:12 PM EDT -----    Appointment Request From: Karen Pacheco    With Provider: Althea Grimmer, PA-C [[facility]]    Preferred Date Range: From 01/01/2018 To 01/08/2018    Preferred Times: Any    Reason: To address the following health maintenance concerns.  Hepatitis C Screening  Tetanus/Tdap    Comments:  Can you please arrange appointment for dates noted?  This would be for both Hepatitis C and Tetanus Vaccine.  Please let me know if there are any issues to consider or is it ok to return to work right after receiving both shots.   Thank you!   Please advise if Patient is due for specified vaccines.

## 2018-01-05 NOTE — Telephone Encounter (Signed)
-----   Message from [facility], Generic sent at 01/01/2018 2:10 PM EDT -----    Appointment Request From: Karen Pacheco    With Provider: Althea Grimmer, PA-C [[facility]]    Preferred Date Range: From 01/01/2018 To 01/08/2018    Preferred Times: Any    Reason: To address the following health maintenance concerns.  Colonoscopy    Comments:  Pls advise appointment schedule.   Thank you.   Marland Kitchen Please advise. Patient has seen Dr. Jean Rosenthal for Yearly annual.

## 2018-01-06 DIAGNOSIS — H1132 Conjunctival hemorrhage, left eye: Secondary | ICD-10-CM | POA: Diagnosis not present

## 2018-01-06 NOTE — Telephone Encounter (Signed)
Please advise Karen Pacheco.

## 2018-01-06 NOTE — Telephone Encounter (Signed)
We do not offer Hep C vaccine. Please have ABC advise and route to her.

## 2018-01-06 NOTE — Telephone Encounter (Signed)
It looks like Jean Rosenthal referred pt at 12/18 annual for colonoscopy. Did pt f/u with Gi? If not, ref needs to be sent back to [place] for review.  Jean Rosenthal can order Hep C labs, but there isn't a vaccine. She can get TdAP when comes to office for labs and return to work afterwards.

## 2018-01-07 NOTE — Telephone Encounter (Signed)
Patient is aware that Karen Pacheco will be calling to schedule referral for Colonoscopy. Patient also wanted to know when she needs to get her next T Dap injection Mychart is should she is due. Please advise

## 2018-01-07 NOTE — Telephone Encounter (Signed)
Agree with Helmut Muster.  We can give TDaP. No vaccine for hepatitis C, but is that what she wants or screening? Also, I'll copy Harriett Sine on this to see if referral is still valid for GI referral. If not, I'll put in another.  Thanks, Thomasene Mohair, MD

## 2018-01-07 NOTE — Telephone Encounter (Signed)
Patient isn't requesting Hep C screening at this time

## 2018-01-07 NOTE — Telephone Encounter (Signed)
Called and left voicemail for patient to call back to speak with me on this matter

## 2018-01-07 NOTE — Telephone Encounter (Signed)
Routing to Dr. Jean Rosenthal to advise patient.

## 2018-04-30 DIAGNOSIS — R21 Rash and other nonspecific skin eruption: Secondary | ICD-10-CM | POA: Diagnosis not present

## 2018-08-09 ENCOUNTER — Encounter: Payer: Self-pay | Admitting: Obstetrics and Gynecology

## 2018-08-09 ENCOUNTER — Ambulatory Visit (INDEPENDENT_AMBULATORY_CARE_PROVIDER_SITE_OTHER): Payer: BLUE CROSS/BLUE SHIELD | Admitting: Obstetrics and Gynecology

## 2018-08-09 ENCOUNTER — Other Ambulatory Visit (HOSPITAL_COMMUNITY)
Admission: RE | Admit: 2018-08-09 | Discharge: 2018-08-09 | Disposition: A | Discharge status: Home / Self Care | Payer: BLUE CROSS/BLUE SHIELD | Source: Ambulatory Visit | Attending: Obstetrics and Gynecology | Admitting: Obstetrics and Gynecology

## 2018-08-09 VITALS — BP 104/68 | HR 98 | Ht 65.0 in | Wt 185.0 lb

## 2018-08-09 DIAGNOSIS — Z1339 Encounter for screening examination for other mental health and behavioral disorders: Secondary | ICD-10-CM

## 2018-08-09 DIAGNOSIS — Z124 Encounter for screening for malignant neoplasm of cervix: Secondary | ICD-10-CM | POA: Diagnosis not present

## 2018-08-09 DIAGNOSIS — Z0000 Encounter for general adult medical examination without abnormal findings: Secondary | ICD-10-CM

## 2018-08-09 DIAGNOSIS — Z1329 Encounter for screening for other suspected endocrine disorder: Secondary | ICD-10-CM

## 2018-08-09 DIAGNOSIS — Z131 Encounter for screening for diabetes mellitus: Secondary | ICD-10-CM

## 2018-08-09 DIAGNOSIS — Z1331 Encounter for screening for depression: Secondary | ICD-10-CM

## 2018-08-09 DIAGNOSIS — Z01419 Encounter for gynecological examination (general) (routine) without abnormal findings: Secondary | ICD-10-CM | POA: Insufficient documentation

## 2018-08-09 DIAGNOSIS — Z13220 Encounter for screening for lipoid disorders: Secondary | ICD-10-CM

## 2018-08-09 LAB — HM PAP SMEAR: HM Pap smear: NORMAL

## 2018-08-09 NOTE — Progress Notes (Signed)
Routine Annual Gynecology Examination   PCP: Conard NovakJackson, Prophet Renwick D, MD  Chief Complaint  Patient presents with  . Gynecologic Exam   History of Present Illness: Patient is a 57 y.o. [id number] presents for annual exam. The patient has no complaints today.   Menopausal bleeding: denies  Menopausal symptoms: denies  Breast symptoms: denies  Last pap smear: 3 years ago.  Result Normal  Last mammogram: 11 months years ago.  Result Normal  Past Medical History:  Diagnosis Date  . Arthritis    knees  . Complication of anesthesia    After knee surgery, felt like she couldn't breathe, SATS were fine.  Marland Kitchen. Heart murmur    told in past she has MVP  . Mitral valve prolapse   . Scoliosis    mild  . Wears contact lenses     Past Surgical History:  Procedure Laterality Date  . CESAREAN SECTION    . KNEE ARTHROSCOPY    . KNEE ARTHROSCOPY Left 02/02/2015   Procedure: ARTHROSCOPY KNEE;  Surgeon: Erin SonsHarold Kernodle, MD;  [facility];  Service: Orthopedics;  Laterality: Left;    Prior to Admission medications   Medication Sig Start Date End Date Taking? Authorizing Provider  CALCIUM PO Take by mouth.   Yes [provider]  Garlic 10 MG CAPS Take by mouth.   Yes [provider]  Multiple Vitamin (MULTIVITAMIN) capsule Take by mouth.   Yes [provider]  Multiple Vitamins-Minerals (HAIR/SKIN/NAILS PO) Take by mouth.   Yes [provider]  Multiple Vitamins-Minerals (MULTIVITAMIN WITH MINERALS) tablet Take 1 tablet by mouth daily.   Yes [provider]   Allergies: No Known Allergies  Obstetric History: [id number]: G2P2002  Social History   Socioeconomic History  . Marital status: Married    Spouse name: Not on file  . Number of children: Not on file  . Years of education: Not on file  . Highest education level: Not on file  Occupational History  . Not on file  Social Needs  . Financial resource strain: Not on file  . Food  insecurity:    Worry: Not on file    Inability: Not on file  . Transportation needs:    Medical: Not on file    Non-medical: Not on file  Tobacco Use  . Smoking status: Never Smoker  . Smokeless tobacco: Never Used  Substance and Sexual Activity  . Alcohol use: No  . Drug use: No  . Sexual activity: Yes  Lifestyle  . Physical activity:    Days per week: Not on file    Minutes per session: Not on file  . Stress: Not on file  Relationships  . Social connections:    Talks on phone: Not on file    Gets together: Not on file    Attends religious service: Not on file    Active member of club or organization: Not on file    Attends meetings of clubs or organizations: Not on file    Relationship status: Not on file  . Intimate partner violence:    Fear of current or ex partner: Not on file    Emotionally abused: Not on file    Physically abused: Not on file    Forced sexual activity: Not on file  Other Topics Concern  . Not on file  Social History Narrative  . Not on file    Family History  Problem Relation Age of Onset  . Cancer Father   .  Breast cancer Neg Hx     Review of Systems  Constitutional: Negative.   HENT: Negative.   Eyes: Negative.   Respiratory: Negative.   Cardiovascular: Negative.   Gastrointestinal: Negative.   Genitourinary: Negative.   Musculoskeletal: Negative.   Skin: Negative.   Neurological: Negative.   Psychiatric/Behavioral: Negative.      Physical Exam Vitals: BP 104/68 (BP Location: Left Arm, Patient Position: Sitting, Cuff Size: Normal)   Pulse 98   Ht 5\' 5"  (1.651 m)   Wt 185 lb (83.9 kg)   BMI 30.79 kg/m   Physical Exam Constitutional:      General: She is not in acute distress.    Appearance: She is well-developed.  Genitourinary:     Pelvic exam was performed with patient supine.     Uterus normal.     No inguinal adenopathy present in the right or left side.    No signs of injury in the vagina.     No vaginal discharge,  erythema, tenderness or bleeding.     No cervical motion tenderness, discharge, lesion or polyp.     Uterus is mobile.     Uterus is not enlarged or tender.     No uterine mass detected.    Uterus is anteverted.     No right or left adnexal mass present.     Right adnexa not tender or full.     Left adnexa not tender or full.  HENT:     Head: Normocephalic and atraumatic.  Eyes:     General: No scleral icterus. Neck:     Musculoskeletal: Normal range of motion and neck supple.     Thyroid: No thyromegaly.  Cardiovascular:     Rate and Rhythm: Normal rate and regular rhythm.     Heart sounds: No murmur. No friction rub. No gallop.   Pulmonary:     Effort: Pulmonary effort is normal. No respiratory distress.     Breath sounds: Normal breath sounds. No wheezing or rales.  Chest:     Breasts:        Right: No inverted nipple, mass, nipple discharge, skin change or tenderness.        Left: No inverted nipple, mass, nipple discharge, skin change or tenderness.  Abdominal:     General: Bowel sounds are normal. There is no distension.     Palpations: Abdomen is soft. There is no mass.     Tenderness: There is no abdominal tenderness. There is no guarding or rebound.  Musculoskeletal: Normal range of motion.        General: No tenderness.  Lymphadenopathy:     Cervical: No cervical adenopathy.     Lower Body: No right inguinal adenopathy. No left inguinal adenopathy.  Neurological:     Mental Status: She is alert and oriented to person, place, and time.     Cranial Nerves: No cranial nerve deficit.  Skin:    General: Skin is warm and dry.     Findings: No erythema or rash.  Psychiatric:        Behavior: Behavior normal.        Judgment: Judgment normal.     Female chaperone present for pelvic and breast  portions of the physical exam  Results: AUDIT Questionnaire (screen for alcoholism): 0 PHQ-9: 0   Assessment and Plan:  57 y.o. G39P2002 female here for routine annual  gynecologic examination  Plan: Problem List Items Addressed This Visit    None  Visit Diagnoses    Women's annual routine gynecological examination    -  Primary   Relevant Orders   Cytology - PAP   TSH   Hgb A1c w/o eAG   Lipid Panel With LDL/HDL Ratio   CBC with Differential/Platelet   Comprehensive metabolic panel   Screening for depression       Screening for alcoholism       Pap smear for cervical cancer screening       Relevant Orders   Cytology - PAP   Screening cholesterol level       Relevant Orders   Lipid Panel With LDL/HDL Ratio   Screening for thyroid disorder       Relevant Orders   TSH   Screening for diabetes mellitus       Relevant Orders   Hgb A1c w/o eAG   Laboratory exam ordered as part of routine general medical examination       Relevant Orders   TSH   Hgb A1c w/o eAG   Lipid Panel With LDL/HDL Ratio   CBC with Differential/Platelet   Comprehensive metabolic panel      Screening: -- Blood pressure screen normal -- Colonoscopy - due. Scheduled -- Mammogram - due. Patient to call [place] to arrange. She understands that it is her responsibility to arrange this. -- Weight screening: overweight: continue to monitor -- Depression screening negative (PHQ-9) -- Nutrition: normal -- cholesterol screening: will obtain -- osteoporosis screening: not due -- tobacco screening: not using -- alcohol screening: AUDIT questionnaire indicates low-risk usage. -- family history of breast cancer screening: done. not at high risk. -- no evidence of domestic violence or intimate partner violence. -- STD screening: gonorrhea/chlamydia NAAT not collected per patient request. -- pap smear collected per ASCCP guidelines -- flu vaccine did not receive. Declines -- HPV vaccination series: not eligilbe  Thomasene MohairStephen Dontai Pember, MD 08/09/2018 4:21 PM

## 2018-08-13 LAB — CYTOLOGY - PAP
Diagnosis: NEGATIVE
HPV: NOT DETECTED

## 2019-06-06 ENCOUNTER — Emergency Department
Admission: EM | Admit: 2019-06-06 | Discharge: 2019-06-06 | Disposition: A | Discharge status: Home / Self Care | Payer: 59 | Attending: Emergency Medicine | Admitting: Emergency Medicine

## 2019-06-06 ENCOUNTER — Encounter: Payer: Self-pay | Admitting: Emergency Medicine

## 2019-06-06 DIAGNOSIS — Z5321 Procedure and treatment not carried out due to patient leaving prior to being seen by health care provider: Secondary | ICD-10-CM | POA: Diagnosis not present

## 2019-06-06 DIAGNOSIS — R109 Unspecified abdominal pain: Secondary | ICD-10-CM | POA: Diagnosis present

## 2019-06-06 LAB — URINALYSIS, COMPLETE (UACMP) WITH MICROSCOPIC
Bacteria, UA: NONE SEEN
Bilirubin Urine: NEGATIVE
Glucose, UA: NEGATIVE mg/dL
Hgb urine dipstick: NEGATIVE
Ketones, ur: 5 mg/dL — AB
Nitrite: NEGATIVE
Protein, ur: NEGATIVE mg/dL
Specific Gravity, Urine: 1.021 (ref 1.005–1.030)
WBC, UA: >50 WBC/hpf — ABNORMAL HIGH (ref 0–5)
pH: 5 (ref 5.0–8.0)

## 2019-06-06 LAB — COMPREHENSIVE METABOLIC PANEL
ALT: 18 U/L (ref 0–44)
AST: 19 U/L (ref 15–41)
Albumin: 4.2 g/dL (ref 3.5–5.0)
Alkaline Phosphatase: 106 U/L (ref 38–126)
Anion gap: 9 (ref 5–15)
BUN: 18 mg/dL (ref 6–20)
CO2: 26 mmol/L (ref 22–32)
Calcium: 9.6 mg/dL (ref 8.9–10.3)
Chloride: 106 mmol/L (ref 98–111)
Creatinine, Ser: 0.68 mg/dL (ref 0.44–1.00)
GFR calc Af Amer: >60 mL/min (ref >60)
GFR calc non Af Amer: >60 mL/min (ref >60)
Glucose, Bld: 93 mg/dL (ref 70–99)
Potassium: 3.5 mmol/L (ref 3.5–5.1)
Sodium: 141 mmol/L (ref 135–145)
Total Bilirubin: 0.9 mg/dL (ref 0.3–1.2)
Total Protein: 7.3 g/dL (ref 6.5–8.1)

## 2019-06-06 LAB — CBC
HCT: 42.2 % (ref 36.0–46.0)
Hemoglobin: 14 g/dL (ref 12.0–15.0)
MCH: 29.9 pg (ref 26.0–34.0)
MCHC: 33.2 g/dL (ref 30.0–36.0)
MCV: 90 fL (ref 80.0–100.0)
Platelets: 251 10*3/uL (ref 150–400)
RBC: 4.69 MIL/uL (ref 3.87–5.11)
RDW: 13.1 % (ref 11.5–15.5)
WBC: 9.7 10*3/uL (ref 4.0–10.5)
nRBC: 0 % (ref 0.0–0.2)

## 2019-06-06 LAB — POCT PREGNANCY, URINE: Preg Test, Ur: NEGATIVE

## 2019-06-06 LAB — LIPASE, BLOOD: Lipase: 23 U/L (ref 11–51)

## 2019-06-06 LAB — TROPONIN I (HIGH SENSITIVITY): Troponin I (High Sensitivity): <2 ng/L (ref <18)

## 2019-06-06 NOTE — ED Triage Notes (Signed)
Pt c/o constant upper epigastric abdominal pain x2 days. Pt reports worse when she takes a deep breath. Pt denies N/V/D.

## 2019-06-07 LAB — URINE CULTURE

## 2020-03-01 IMAGING — RF DG FLUORO GUIDE NDL PLC/BX
1 series · 2 of 2 positions shown · non-contrast
Comparison: none

CLINICAL DATA: Left shoulder pain, limited mobility of the left
shoulder for 4 months.

[Series 1: cp_standard · 0.18mm/px · 2 of 2 slices shown]
[im 1/2]
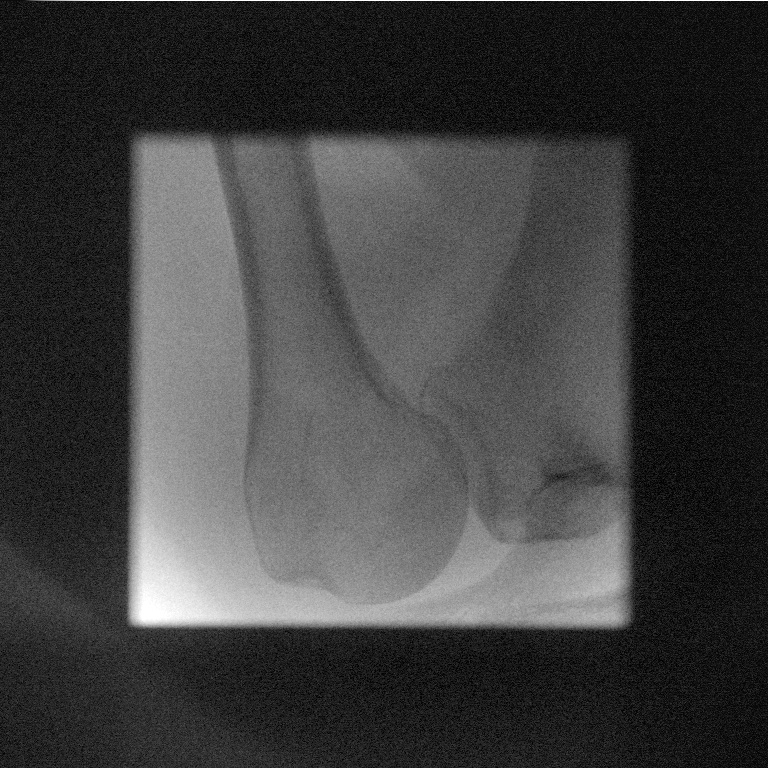
[im 2/2]
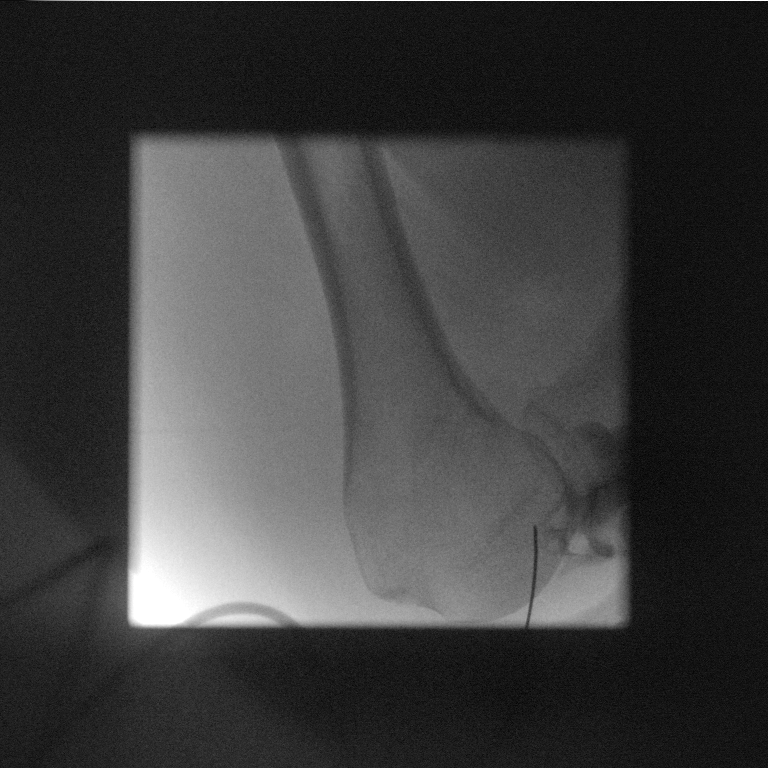

[2 of 2 positions shown; findings below may reference images not displayed]

EXAM:
LEFT SHOULDER INJECTION UNDER FLUOROSCOPY

FLUOROSCOPY TIME:  Fluoroscopy Time:  24 seconds

PROCEDURE:
The risks and benefits of the procedure were discussed with the
patient, and written informed consent was obtained. The patient
stated no history of allergy to contrast media. A formal timeout
procedure was performed with the patient according to departmental
protocol.

The patient was placed supine on the fluoroscopy table and the left
glenohumeral joint was identified under fluoroscopy. The skin
overlying the left glenohumeral joint was subsequently cleaned with
Chloraprep and a sterile drape was placed over the area of interest.
5 ml 1% Lidocaine was used to anesthetize the skin around the needle
insertion site.

A 22 gauge spinal needle was inserted into the left glenohumeral
joint under fluoroscopy. Position was confirmed with injection of
less than 1ml of 1sovue-EXX under fluoroscopy.

12 ml of gadolinium mixture (0.1 ml of Multihance mixed with 20 ml
of sterile saline) was injected into the left glenohumeral joint.

The needle was removed and hemostasis was achieved. The patient was
subsequently transferred to MRI for imaging.
IMPRESSION: Successful left shoulder arthrogram injection prior to MRI.

## 2020-03-03 ENCOUNTER — Other Ambulatory Visit: Payer: Self-pay

## 2020-03-03 ENCOUNTER — Emergency Department: Payer: No Typology Code available for payment source

## 2020-03-03 ENCOUNTER — Emergency Department
Admission: EM | Admit: 2020-03-03 | Discharge: 2020-03-03 | Disposition: A | Discharge status: Home / Self Care | Payer: No Typology Code available for payment source | Attending: Emergency Medicine | Admitting: Emergency Medicine

## 2020-03-03 DIAGNOSIS — R002 Palpitations: Secondary | ICD-10-CM | POA: Diagnosis not present

## 2020-03-03 LAB — BASIC METABOLIC PANEL
Anion gap: 8 (ref 5–15)
BUN: 19 mg/dL (ref 6–20)
CO2: 28 mmol/L (ref 22–32)
Calcium: 9.1 mg/dL (ref 8.9–10.3)
Chloride: 107 mmol/L (ref 98–111)
Creatinine, Ser: 0.85 mg/dL (ref 0.44–1.00)
GFR calc Af Amer: >60 mL/min (ref >60)
GFR calc non Af Amer: >60 mL/min (ref >60)
Glucose, Bld: 114 mg/dL — ABNORMAL HIGH (ref 70–99)
Potassium: 3.4 mmol/L — ABNORMAL LOW (ref 3.5–5.1)
Sodium: 143 mmol/L (ref 135–145)

## 2020-03-03 LAB — TROPONIN I (HIGH SENSITIVITY)
Troponin I (High Sensitivity): <2 ng/L (ref <18)
Troponin I (High Sensitivity): 4 ng/L (ref <18)

## 2020-03-03 LAB — CBC
HCT: 39.6 % (ref 36.0–46.0)
Hemoglobin: 13.2 g/dL (ref 12.0–15.0)
MCH: 30.1 pg (ref 26.0–34.0)
MCHC: 33.3 g/dL (ref 30.0–36.0)
MCV: 90.2 fL (ref 80.0–100.0)
Platelets: 254 10*3/uL (ref 150–400)
RBC: 4.39 MIL/uL (ref 3.87–5.11)
RDW: 13.2 % (ref 11.5–15.5)
WBC: 6.1 10*3/uL (ref 4.0–10.5)
nRBC: 0 % (ref 0.0–0.2)

## 2020-03-03 MED ORDER — SODIUM CHLORIDE 0.9% FLUSH: 3.0000 mL | Freq: Once | INTRAVENOUS | Status: DC

## 2020-03-03 MED ORDER — POTASSIUM CHLORIDE CRYS ER 20 MEQ PO TBCR
40.0000 meq | EXTENDED_RELEASE_TABLET | Freq: Once | ORAL | Status: AC
  Administered 2020-03-03: 40 meq via ORAL
  Filled 2020-03-03: qty 2

## 2020-03-03 NOTE — ED Notes (Signed)
Pt states that since the 8th she has experienced periodic feelings of sweatiness, heaviness of chest and racing hr. Pt has long list of possibilities that may be causing this to occur and states that she came tonight to be checked out because it has been going on for long enough. Pt states she ahs been stung by bees, may be due to anxiety or stress, states she may be dehydrated from working in the yard on a hot day and many other things that are listed on her phone.

## 2020-03-03 NOTE — Discharge Instructions (Addendum)
Your workup in the Emergency Department today was reassuring.  We did not find any specific abnormalities.  We recommend you drink plenty of fluids, take your regular medications and/or any new ones prescribed today, and follow up with the doctor(s) listed in these documents as recommended.  Return to the Emergency Department if you develop new or worsening symptoms that concern you.  

## 2020-03-03 NOTE — ED Provider Notes (Signed)
[facility] Emergency Department Provider Note  ____________________________________________   First MD Initiated Contact with Patient 03/03/20 [phone]     (approximate)  I have reviewed the triage vital signs and the nursing notes.   HISTORY  Chief Complaint Palpitations    HPI Karen Pacheco is a 59 y.o. female with medical history as listed below who presents for evaluation of palpitations.  She said that she has been having palpitations for about 2 weeks intermittently.  However she says that when she feels the sensation of rapid heart rate, she checks the heart monitor on her watch, and her heart rate is always normal.  She was wondering if it could be a reaction to a bee sting or other insect bite that she got at the beginning of the month.  The evidence of the bee sting and insect bites have resolved but she thought maybe it was still an allergic reaction.  She denies chest pain or shortness of breath.  She denies nausea, vomiting, abdominal pain, dysuria, and diarrhea.  Nothing in particular makes the symptoms better or worse, they can happen at any time, and the feeling of rapid heart rate is occasionally severe.         Past Medical History:  Diagnosis Date  . Arthritis    knees  . Complication of anesthesia    After knee surgery, felt like she couldn't breathe, SATS were fine.  Marland Kitchen Heart murmur    told in past she has MVP  . Mitral valve prolapse   . Scoliosis    mild  . Wears contact lenses     There are no problems to display for this patient.   Past Surgical History:  Procedure Laterality Date  . CESAREAN SECTION    . KNEE ARTHROSCOPY    . KNEE ARTHROSCOPY Left 02/02/2015   Procedure: ARTHROSCOPY KNEE;  Surgeon: Erin Sons, MD;  Location: [facility];  Service: Orthopedics;  Laterality: Left;    Prior to Admission medications   Medication Sig Start Date End Date Taking? Authorizing Provider  CALCIUM PO Take by  mouth.    [provider]  Garlic 10 MG CAPS Take by mouth.    [provider]  Multiple Vitamin (MULTIVITAMIN) capsule Take by mouth.    [provider]  Multiple Vitamins-Minerals (HAIR/SKIN/NAILS PO) Take by mouth.    [provider]  Multiple Vitamins-Minerals (MULTIVITAMIN WITH MINERALS) tablet Take 1 tablet by mouth daily.    [provider]    Allergies No known allergies  Family History  Problem Relation Age of Onset  . Cancer Father   . Breast cancer Neg Hx     Social History Social History   Tobacco Use  . Smoking status: Never Smoker  . Smokeless tobacco: Never Used  Vaping Use  . Vaping Use: Never used  Substance Use Topics  . Alcohol use: No  . Drug use: No    Review of Systems Constitutional: No fever/chills Eyes: No visual changes. ENT: No sore throat. Cardiovascular: Palpitations.  Denies chest pain. Respiratory: Denies shortness of breath. Gastrointestinal: No abdominal pain.  No nausea, no vomiting.  No diarrhea.  No constipation. Genitourinary: Negative for dysuria. Musculoskeletal: Negative for neck pain.  Negative for back pain. Integumentary: Negative for rash. Neurological: Negative for headaches, focal weakness or numbness.   ____________________________________________   PHYSICAL EXAM:  VITAL SIGNS: ED Triage Vitals  Enc Vitals Group     BP 03/03/20 0103 (!) 134/81  Pulse Rate 03/03/20 0103 58     Resp 03/03/20 0103 16     Temp 03/03/20 0103 97.8 F (36.6 C)     Temp Source 03/03/20 0103 Oral     SpO2 03/03/20 0103 99 %     Weight 03/03/20 0105 74.8 kg (165 lb)     Height 03/03/20 0105 1.651 m (5\' 5" )     Head Circumference --      Peak Flow --      Pain Score 03/03/20 0113 0     Pain Loc --      Pain Edu? --      Excl. in GC? --     Constitutional: Alert and oriented.  Eyes: Conjunctivae are normal.  Head: Atraumatic. Nose: No congestion/rhinnorhea. Mouth/Throat: Patient is  wearing a mask. Neck: No stridor.  No meningeal signs.   Cardiovascular: Normal rate, regular rhythm. Good peripheral circulation. Grossly normal heart sounds. Respiratory: Normal respiratory effort.  No retractions. Gastrointestinal: Soft and nontender. No distention.  Musculoskeletal: No lower extremity tenderness nor edema. No gross deformities of extremities. Neurologic:  Normal speech and language. No gross focal neurologic deficits are appreciated.  Skin:  Skin is warm, dry and intact. Psychiatric: Mood and affect are normal. Speech and behavior are normal.  ____________________________________________   LABS (all labs ordered are listed, but only abnormal results are displayed)  Labs Reviewed  BASIC METABOLIC PANEL - Abnormal; Notable for the following components:      Result Value   Potassium 3.4 (*)    Glucose, Bld 114 (*)    All other components within normal limits  CBC  TROPONIN I (HIGH SENSITIVITY)  TROPONIN I (HIGH SENSITIVITY)   ____________________________________________  EKG  ED ECG REPORT I, 03/05/20, the attending physician, personally viewed and interpreted this ECG.  Date: 03/03/2020 EKG Time: 1:08 AM Rate: 55 Rhythm: Sinus bradycardia QRS Axis: normal Intervals: normal ST/T Wave abnormalities: normal Narrative Interpretation: no evidence of acute ischemia  ____________________________________________  RADIOLOGY I, 03/05/2020, personally viewed and evaluated these images (plain radiographs) as part of my medical decision making, as well as reviewing the written report by the radiologist.  ED MD interpretation: No indication of acute abnormality on chest x-ray  Official radiology report(s): DG Chest 2 View  Result Date: 03/03/2020 CLINICAL DATA:  Palpitations EXAM: CHEST - 2 VIEW COMPARISON:  None. FINDINGS: The heart size and mediastinal contours are within normal limits. Both lungs are clear. The visualized skeletal structures are  unremarkable. IMPRESSION: No active cardiopulmonary disease. Electronically Signed   By: 03/05/2020 M.D.   On: 03/03/2020 02:07    ____________________________________________   PROCEDURES   Procedure(s) performed (including Critical Care):  Procedures   ____________________________________________   INITIAL IMPRESSION / MDM / ASSESSMENT AND PLAN / ED COURSE  As part of my medical decision making, I reviewed the following data within the electronic MEDICAL RECORD NUMBER Nursing notes reviewed and incorporated, Labs reviewed , EKG interpreted , Old chart reviewed, Radiograph reviewed  and Notes from prior ED visits   Differential diagnosis includes, but is not limited to, SVT or other benign cardiac arrhythmia, A. fib, electrolyte or metabolic abnormality, less likely PE or ACS.  The patient has a reassuring EKG with sinus bradycardia, otherwise unremarkable.  Essentially normal basic metabolic panel and CBC other than a very slightly decreased potassium level of 3.4 for which I ordered 40 mEq by mouth just in case.  High-sensitivity troponin is normal x2.  I provided reassurance to  the patient and explained there is no evidence of an emergent medical condition.  She would like contact information for cardiology so I provided it for her and she will follow up as an outpatient.  Insert gave precautions           ____________________________________________  FINAL CLINICAL IMPRESSION(S) / ED DIAGNOSES  Final diagnoses:  Palpitations     MEDICATIONS GIVEN DURING THIS VISIT:  Medications  potassium chloride SA (KLOR-CON) CR tablet 40 mEq (has no administration in time range)     ED Discharge Orders    None      *Please note:  Karen Pacheco was evaluated in Emergency Department on 03/03/2020 for the symptoms described in the history of present illness. She was evaluated in the context of the global COVID-19 pandemic, which necessitated consideration that the  patient might be at risk for infection with the SARS-CoV-2 virus that causes COVID-19. Institutional protocols and algorithms that pertain to the evaluation of patients at risk for COVID-19 are in a state of rapid change based on information released by regulatory bodies including the CDC and federal and state organizations. These policies and algorithms were followed during the patient's care in the ED.  Some ED evaluations and interventions may be delayed as a result of limited staffing during and after the pandemic.*  Note:  This document was prepared using Dragon voice recognition software and may include unintentional dictation errors.   Loleta Rose, MD 03/03/20 [phone]

## 2020-03-03 NOTE — ED Triage Notes (Signed)
Pt to the er for heart palpitations and inability to get comfortable. This started on the 8th, Pt denies medical problems.

## 2020-03-12 ENCOUNTER — Encounter: Payer: Self-pay | Admitting: Obstetrics and Gynecology

## 2020-03-12 ENCOUNTER — Ambulatory Visit (INDEPENDENT_AMBULATORY_CARE_PROVIDER_SITE_OTHER): Payer: No Typology Code available for payment source | Admitting: Obstetrics and Gynecology

## 2020-03-12 ENCOUNTER — Other Ambulatory Visit: Payer: Self-pay

## 2020-03-12 VITALS — BP 128/84 | Ht 65.0 in | Wt 172.0 lb

## 2020-03-12 DIAGNOSIS — Z1339 Encounter for screening examination for other mental health and behavioral disorders: Secondary | ICD-10-CM | POA: Diagnosis not present

## 2020-03-12 DIAGNOSIS — Z01419 Encounter for gynecological examination (general) (routine) without abnormal findings: Secondary | ICD-10-CM

## 2020-03-12 DIAGNOSIS — Z1331 Encounter for screening for depression: Secondary | ICD-10-CM | POA: Diagnosis not present

## 2020-03-12 DIAGNOSIS — Z1211 Encounter for screening for malignant neoplasm of colon: Secondary | ICD-10-CM | POA: Diagnosis not present

## 2020-03-12 NOTE — Progress Notes (Signed)
Routine Annual Gynecology Examination   PCP: Conard Novak, MD  Chief Complaint  Patient presents with  . Annual Exam   History of Present Illness: Patient is a 59 y.o. G2P2002 presents for annual exam. The patient has no complaints today.   Menopausal bleeding: denies  Menopausal symptoms: denies  Breast symptoms: denies  Last pap smear: 1.5  years ago.  Result Normal, HPV negative  Last mammogram: 01/2020.  Result Normal   Last Colonoscopy: never had.  She's had palpitations since 7/8. She went to the ER a week or so ago. Her workup was normal. She was referred to cardiology. She is going to make an appointment with the cardiologist.    She lost her husband to COVID19 in February this year.  Past Medical History:  Diagnosis Date  . Arthritis    knees  . Complication of anesthesia    After knee surgery, felt like she couldn't breathe, SATS were fine.  Marland Kitchen Heart murmur    told in past she has MVP  . Mitral valve prolapse   . Scoliosis    mild  . Wears contact lenses     Past Surgical History:  Procedure Laterality Date  . CESAREAN SECTION    . KNEE ARTHROSCOPY    . KNEE ARTHROSCOPY Left 02/02/2015   Procedure: ARTHROSCOPY KNEE;  Surgeon: Erin Sons, MD;  Location: [facility];  Service: Orthopedics;  Laterality: Left;    Prior to Admission medications   Medication Sig Start Date End Date Taking? Authorizing Provider  CALCIUM PO Take by mouth.   Yes [provider]  Multiple Vitamin (MULTIVITAMIN) capsule Take by mouth.   Yes [provider]  Multiple Vitamins-Minerals (HAIR/SKIN/NAILS PO) Take by mouth.   Yes [provider]  Multiple Vitamins-Minerals (MULTIVITAMIN WITH MINERALS) tablet Take 1 tablet by mouth daily.   Yes [provider]  Garlic 10 MG CAPS Take by mouth. Patient not taking: Reported on 03/12/2020    [provider]    Allergies  Allergen Reactions  . No Known Allergies     Obstetric History: [id number]  Social History   Socioeconomic History  . Marital status: Married    Spouse name: Not on file  . Number of children: Not on file  . Years of education: Not on file  . Highest education level: Not on file  Occupational History  . Not on file  Tobacco Use  . Smoking status: Never Smoker  . Smokeless tobacco: Never Used  Vaping Use  . Vaping Use: Never used  Substance and Sexual Activity  . Alcohol use: No  . Drug use: No  . Sexual activity: Yes  Other Topics Concern  . Not on file  Social History Narrative  . Not on file   Social Determinants of Health   Financial Resource Strain:   . Difficulty of Paying Living Expenses:   Food Insecurity:   . Worried About Programme researcher, broadcasting/film/video in the Last Year:   . Barista in the Last Year:   Transportation Needs:   . Freight forwarder (Medical):   Marland Kitchen Lack of Transportation (Non-Medical):   Physical Activity:   . Days of Exercise per Week:   . Minutes of Exercise per Session:   Stress:   . Feeling of Stress :   Social Connections:   . Frequency of Communication with Friends and Family:   . Frequency of Social Gatherings with Friends and Family:   . Attends  Religious Services:   . Active Member of Clubs or Organizations:   . Attends Banker Meetings:   Marland Kitchen Marital Status:   Intimate Partner Violence:   . Fear of Current or Ex-Partner:   . Emotionally Abused:   Marland Kitchen Physically Abused:   . Sexually Abused:     Family History  Problem Relation Age of Onset  . Cancer Father   . Breast cancer Neg Hx     Review of Systems  Constitutional: Negative.   HENT: Negative.   Eyes: Negative.   Respiratory: Negative.   Cardiovascular: Positive for palpitations. Negative for chest pain, orthopnea, claudication, leg swelling and PND.  Gastrointestinal: Negative.   Genitourinary: Negative.   Musculoskeletal: Negative.   Skin: Negative.   Neurological: Negative.    Psychiatric/Behavioral: Negative.      Physical Exam Vitals: BP 128/84   Ht 5\' 5"  (1.651 m)   Wt 172 lb (78 kg)   BMI 28.62 kg/m   Physical Exam Constitutional:      General: She is not in acute distress.    Appearance: Normal appearance. She is well-developed.  Genitourinary:     Pelvic exam was performed with patient in the lithotomy position.     Vulva, urethra, bladder and uterus normal.     No inguinal adenopathy present in the right or left side.    No signs of injury in the vagina.     No vaginal discharge, erythema, tenderness or bleeding.     No cervical motion tenderness, discharge, lesion or polyp.     Uterus is mobile.     Uterus is not enlarged or tender.     No uterine mass detected.    Uterus is anteverted.     No right or left adnexal mass present.     Right adnexa not tender or full.     Left adnexa not tender or full.  HENT:     Head: Normocephalic and atraumatic.  Eyes:     General: No scleral icterus.    Conjunctiva/sclera: Conjunctivae normal.  Neck:     Thyroid: No thyromegaly.  Cardiovascular:     Rate and Rhythm: Normal rate and regular rhythm.     Heart sounds: No murmur heard.  No friction rub. No gallop.   Pulmonary:     Effort: Pulmonary effort is normal. No respiratory distress.     Breath sounds: Normal breath sounds. No wheezing or rales.  Chest:     Breasts:        Right: No inverted nipple, mass, nipple discharge, skin change or tenderness.        Left: No inverted nipple, mass, nipple discharge, skin change or tenderness.  Abdominal:     General: Bowel sounds are normal. There is no distension.     Palpations: Abdomen is soft. There is no mass.     Tenderness: There is no abdominal tenderness. There is no guarding or rebound.  Musculoskeletal:        General: No swelling or tenderness. Normal range of motion.     Cervical back: Normal range of motion and neck supple.  Lymphadenopathy:     Cervical: No cervical adenopathy.      Lower Body: No right inguinal adenopathy. No left inguinal adenopathy.  Neurological:     General: No focal deficit present.     Mental Status: She is alert and oriented to person, place, and time.     Cranial Nerves: No cranial nerve deficit.  Skin:  General: Skin is warm and dry.     Findings: No erythema or rash.  Psychiatric:        Mood and Affect: Mood normal.        Behavior: Behavior normal.        Judgment: Judgment normal.      Female chaperone present for pelvic and breast  portions of the physical exam  Results: AUDIT Questionnaire (screen for alcoholism): 0 PHQ-9: 0   Assessment and Plan:  59 y.o. G72P2002 female here for routine annual gynecologic examination  Plan: Problem List Items Addressed This Visit    None    Visit Diagnoses    Women's annual routine gynecological examination    -  Primary   Relevant Orders   Cologuard   Screening for depression       Screening for alcoholism       Screen for colon cancer       Relevant Orders   Cologuard      Screening: -- Blood pressure screen normal -- Colonoscopy - due. Elects to use Cologuard. Discussed pros/cons  and cost of diagnostic colonoscopy, if positive -- Mammogram - not due -- Weight screening: normal -- Depression screening negative (PHQ-9) -- Nutrition: normal -- cholesterol screening: not due for screening -- osteoporosis screening: not due -- tobacco screening: not using -- alcohol screening: AUDIT questionnaire indicates low-risk usage. -- family history of breast cancer screening: done. not at high risk. -- no evidence of domestic violence or intimate partner violence. -- STD screening: gonorrhea/chlamydia NAAT not collected per patient request. -- pap smear not collected per [facility]P guidelines  Thomasene Mohair, MD 03/12/2020 10:46 AM

## 2020-04-01 ENCOUNTER — Emergency Department: Payer: No Typology Code available for payment source

## 2020-04-01 ENCOUNTER — Other Ambulatory Visit: Payer: Self-pay

## 2020-04-01 ENCOUNTER — Emergency Department
Admission: EM | Admit: 2020-04-01 | Discharge: 2020-04-02 | Disposition: A | Discharge status: Home / Self Care | Payer: No Typology Code available for payment source | Attending: Emergency Medicine | Admitting: Emergency Medicine

## 2020-04-01 DIAGNOSIS — R002 Palpitations: Secondary | ICD-10-CM | POA: Diagnosis present

## 2020-04-01 DIAGNOSIS — R0789 Other chest pain: Secondary | ICD-10-CM | POA: Diagnosis not present

## 2020-04-01 LAB — TROPONIN I (HIGH SENSITIVITY)
Troponin I (High Sensitivity): <2 ng/L (ref <18)
Troponin I (High Sensitivity): <2 ng/L (ref <18)

## 2020-04-01 LAB — BASIC METABOLIC PANEL
Anion gap: 13 (ref 5–15)
BUN: 16 mg/dL (ref 6–20)
CO2: 23 mmol/L (ref 22–32)
Calcium: 9.7 mg/dL (ref 8.9–10.3)
Chloride: 103 mmol/L (ref 98–111)
Creatinine, Ser: 0.77 mg/dL (ref 0.44–1.00)
GFR calc Af Amer: >60 mL/min (ref >60)
GFR calc non Af Amer: >60 mL/min (ref >60)
Glucose, Bld: 101 mg/dL — ABNORMAL HIGH (ref 70–99)
Potassium: 3.6 mmol/L (ref 3.5–5.1)
Sodium: 139 mmol/L (ref 135–145)

## 2020-04-01 LAB — CBC
HCT: 42.4 % (ref 36.0–46.0)
Hemoglobin: 14.4 g/dL (ref 12.0–15.0)
MCH: 30.3 pg (ref 26.0–34.0)
MCHC: 34 g/dL (ref 30.0–36.0)
MCV: 89.1 fL (ref 80.0–100.0)
Platelets: 253 10*3/uL (ref 150–400)
RBC: 4.76 MIL/uL (ref 3.87–5.11)
RDW: 13.1 % (ref 11.5–15.5)
WBC: 5.7 10*3/uL (ref 4.0–10.5)
nRBC: 0 % (ref 0.0–0.2)

## 2020-04-01 LAB — TSH: TSH: 0.53 u[IU]/mL (ref 0.350–4.500)

## 2020-04-01 LAB — MAGNESIUM: Magnesium: 2.4 mg/dL (ref 1.7–2.4)

## 2020-04-01 LAB — POCT PREGNANCY, URINE: Preg Test, Ur: NEGATIVE

## 2020-04-01 MED ORDER — ACETAMINOPHEN 500 MG PO TABS
1000.0000 mg | ORAL_TABLET | Freq: Once | ORAL | Status: AC
  Administered 2020-04-01: 1000 mg via ORAL
  Filled 2020-04-01: qty 2

## 2020-04-01 NOTE — ED Notes (Signed)
Pt denies pain. Pt states she "feels the flutter", "gets sweats and R arm tingling when her heart goes bananas".  pt states "she recently was prescribed mag and over the past 2 days and this has felt like it helped her symptoms except for today".

## 2020-04-01 NOTE — ED Provider Notes (Signed)
[facility] Provider Note   ____________________________________________   First MD Initiated Contact with Patient 04/01/20 2147     (approximate)  I have reviewed the triage vital signs and the nursing notes.   HISTORY  Chief Complaint Palpitations    HPI Karen Pacheco is a 59 y.o. female with possible history of mitral valve prolapse who presents to the ED complaining of palpitations.  Patient states that starting 1 month ago she initially had intermittent heart racing with a pressure in her chest.  Symptoms were not exacerbated or alleviated by anything, but have become more frequent recently.  She states that over the past couple of days she has felt the palpitations constantly along with the pressure in her chest, but she denies any difficulty breathing.  She has not had any fevers or cough, states she has otherwise been feeling well.  She was recently seen in the ED for this with unremarkable work-up, also had outpatient follow-up with cardiology with recent Holter monitor and echocardiogram.  She has not yet heard back about the results of Holter monitor or echocardiogram, now states that the symptoms are becoming more difficult for her to tolerate and making it difficult for her to sleep.  She does admit being under stress due to her husband's death in 10/06/22 from COVID-19, but states she does not feel like this is the source of her symptoms.        Past Medical History:  Diagnosis Date  . Arthritis    knees  . Complication of anesthesia    After knee surgery, felt like she couldn't breathe, SATS were fine.  Marland Kitchen Heart murmur    told in past she has MVP  . Mitral valve prolapse   . Scoliosis    mild  . Wears contact lenses     There are no problems to display for this patient.   Past Surgical History:  Procedure Laterality Date  . CESAREAN SECTION    . KNEE ARTHROSCOPY    . KNEE ARTHROSCOPY Left 02/02/2015    Procedure: ARTHROSCOPY KNEE;  Surgeon: Erin Sons, MD;  Location: [facility];  Service: Orthopedics;  Laterality: Left;    Prior to Admission medications   Medication Sig Start Date End Date Taking? Authorizing Provider  CALCIUM PO Take by mouth.    [provider]  Garlic 10 MG CAPS Take by mouth. Patient not taking: Reported on 03/12/2020    [provider]  Multiple Vitamin (MULTIVITAMIN) capsule Take by mouth.    [provider]  Multiple Vitamins-Minerals (HAIR/SKIN/NAILS PO) Take by mouth.    [provider]  Multiple Vitamins-Minerals (MULTIVITAMIN WITH MINERALS) tablet Take 1 tablet by mouth daily.    [provider]    Allergies No known allergies  Family History  Problem Relation Age of Onset  . Cancer Father   . Breast cancer Neg Hx     Social History Social History   Tobacco Use  . Smoking status: Never Smoker  . Smokeless tobacco: Never Used  Vaping Use  . Vaping Use: Never used  Substance Use Topics  . Alcohol use: No  . Drug use: No    Review of Systems  Constitutional: No fever/chills Eyes: No visual changes. ENT: No sore throat. Cardiovascular: Positive for palpitations and chest pain. Respiratory: Denies shortness of breath. Gastrointestinal: No abdominal pain.  No nausea, no vomiting.  No diarrhea.  No constipation. Genitourinary: Negative for dysuria. Musculoskeletal: Negative for back  pain. Skin: Negative for rash. Neurological: Negative for headaches, focal weakness or numbness.  ____________________________________________   PHYSICAL EXAM:  VITAL SIGNS: ED Triage Vitals [04/01/20 1320]  Enc Vitals Group     BP (!) 144/82     Pulse Rate 73     Resp 18     Temp 98.4 F (36.9 C)     Temp Source Oral     SpO2 98 %     Weight 171 lb 15.3 oz (78 kg)     Height 5\' 5"  (1.651 m)     Head Circumference      Peak Flow      Pain Score 0     Pain Loc      Pain Edu?      Excl. in  GC?     Constitutional: Alert and oriented. Eyes: Conjunctivae are normal. Head: Atraumatic. Nose: No congestion/rhinnorhea. Mouth/Throat: Mucous membranes are moist. Neck: Normal ROM Cardiovascular: Normal rate, regular rhythm. Grossly normal heart sounds.  2+ radial pulses bilaterally. Respiratory: Normal respiratory effort.  No retractions. Lungs CTAB. Gastrointestinal: Soft and nontender. No distention. Genitourinary: deferred Musculoskeletal: No lower extremity tenderness nor edema. Neurologic:  Normal speech and language. No gross focal neurologic deficits are appreciated. Skin:  Skin is warm, dry and intact. No rash noted. Psychiatric: Mood and affect are normal. Speech and behavior are normal.  ____________________________________________   LABS (all labs ordered are listed, but only abnormal results are displayed)  Labs Reviewed  BASIC METABOLIC PANEL - Abnormal; Notable for the following components:      Result Value   Glucose, Bld 101 (*)    All other components within normal limits  CBC  MAGNESIUM  TSH  POC URINE PREG, ED  POCT PREGNANCY, URINE  TROPONIN I (HIGH SENSITIVITY)  TROPONIN I (HIGH SENSITIVITY)   ____________________________________________  EKG  ED ECG REPORT I, , the attending physician, personally viewed and interpreted this ECG.   Date: 04/01/2020  EKG Time: 13:20  Rate: 67  Rhythm: normal sinus rhythm  Axis: Normal  Intervals:none  ST&T Change: None   PROCEDURES  Procedure(s) performed (including Critical Care):  Procedures   ____________________________________________   INITIAL IMPRESSION / ASSESSMENT AND PLAN / ED COURSE      59 year old female with past medical history of mitral valve prolapse presents to the ED complaining of 1 month of palpitations and chest pressure, initially intermittent but have now become constant over the past couple of days.   EKG shows normal sinus rhythm with no ischemic changes  and patient continues to have regular pulse on my assessment.  We will briefly observe on the cardiac monitor to ensure no arrhythmia at this time, she did have recent Holter monitor however I am unable to see these results.  I am able to see the results of her recent echocardiogram that showed normal EF with mild valvular regurgitation.  This could potentially be contributing to her symptoms, we will also check TSH and magnesium.  TSH and magnesium within normal limits, patient has remained in normal sinus rhythm on cardiac monitor with occasional PVC.  Work-up is unremarkable at this time and patient is appropriate for discharge home with cardiology follow-up to review her echocardiogram and Holter monitor results.  She was counseled to return to the ED for new or worsening symptoms, patient agrees with plan.      ____________________________________________   FINAL CLINICAL IMPRESSION(S) / ED DIAGNOSES  Final diagnoses:  Palpitations     ED Discharge  Orders    None       Note:  This document was prepared using Dragon voice recognition software and may include unintentional dictation errors.   Chesley Noon, MD 04/01/20 [phone]

## 2020-04-01 NOTE — ED Triage Notes (Signed)
Pt comes in for heart palpitations. Has been seen by cardiology. Pt has worn holter monitor. Pt states that heart fluttering is debilitating. Pt in no other distress.

## 2020-04-03 ENCOUNTER — Encounter: Payer: Self-pay | Admitting: Obstetrics and Gynecology

## 2020-04-03 LAB — COLOGUARD: Cologuard: NEGATIVE

## 2020-04-09 LAB — COLOGUARD: COLOGUARD: NEGATIVE

## 2020-04-09 LAB — EXTERNAL GENERIC LAB PROCEDURE: COLOGUARD: NEGATIVE

## 2020-08-16 ENCOUNTER — Other Ambulatory Visit: Payer: Self-pay

## 2020-08-16 ENCOUNTER — Ambulatory Visit (INDEPENDENT_AMBULATORY_CARE_PROVIDER_SITE_OTHER): Payer: No Typology Code available for payment source | Admitting: Dermatology

## 2020-08-16 DIAGNOSIS — L820 Inflamed seborrheic keratosis: Secondary | ICD-10-CM

## 2020-08-16 DIAGNOSIS — L720 Epidermal cyst: Secondary | ICD-10-CM | POA: Diagnosis not present

## 2020-08-16 DIAGNOSIS — M67441 Ganglion, right hand: Secondary | ICD-10-CM | POA: Diagnosis not present

## 2020-08-16 DIAGNOSIS — Z91030 Bee allergy status: Secondary | ICD-10-CM

## 2020-08-16 DIAGNOSIS — M67449 Ganglion, unspecified hand: Secondary | ICD-10-CM

## 2020-08-16 NOTE — Patient Instructions (Signed)

## 2020-08-16 NOTE — Progress Notes (Unsigned)
   New Patient Visit  Subjective  BRYLEA PITA is a 60 y.o. female who presents for the following: Skin Problem (Check spots on face, pt had a white spot on the L lower eyelid several weeks ago but  she squeezed it out ) and Rash (Pt would like to know what to do for allergy to bee sting and insects, she report her skin is clear today but she would like to know what to do in the future.).  The following portions of the chart were reviewed this encounter and updated as appropriate:   Tobacco  Allergies  Meds  Problems  Med Hx  Surg Hx  Fam Hx     Review of Systems:  No other skin or systemic complaints except as noted in HPI or Assessment and Plan.  Objective  Well appearing patient in no apparent distress; mood and affect are within normal limits.  A focused examination was performed including face, hands. Relevant physical exam findings are noted in the Assessment and Plan.  Objective  Left low med eyelid margin: Small Subcutaneous papule/nodule with erythema and edema, tender to touch.   Objective  Right index finger anterior edge of PIP joint: 0.4 cm Subcutaneous nodule.   Objective  L upper eyelid margin: Erythematous keratotic or waxy stuck-on papule or plaque.   Objective  exts, trunk: Clear today    Assessment & Plan  Epidermal inclusion cyst Left low med eyelid margin Cyst, Benign ok to keep unless bothersome Better since per patient since she squeezed this area  Observe  No treatment needed, area appears to be going away.  May recur.  Will consider procedure if recurs.  Digital mucous cyst Right index finger anterior edge of PIP joint 0.4 cm Asymptomatic, Benign ok to keep unless bothersome, if area becomes bothersome may consider Intralesional steroid or Asclera injection or evaluation by a orthopedic surgeon or hand surgeon  Inflamed seborrheic keratosis L upper eyelid margin Discussed benign appearing nature.  Pt declines treatment.   Benign-appearing.  Observation.  Call clinic for new or changing lesions.  Recommend daily use of broad spectrum spf 30+ sunscreen to sun-exposed areas.   Bee sting and mosquito bite reaction with significant swelling at injury site - history of.  No anaphylactic-like signs or symptoms exts, trunk Common condition Recommend taking an otc antihistamine like Allergy or Claritin daily prn.  Go to ER if other systemic symptoms especially respiratory symptoms. Discuss with PCP.  Return if symptoms worsen or fail to improve.  IAngelique Holm, CMA, am acting as scribe for Armida Sans, MD .  Documentation: I have reviewed the above documentation for accuracy and completeness, and I agree with the above.  Armida Sans, MD

## 2020-08-17 ENCOUNTER — Encounter: Payer: Self-pay | Admitting: Dermatology

## 2020-11-14 ENCOUNTER — Encounter: Payer: Self-pay | Admitting: Emergency Medicine

## 2020-11-14 ENCOUNTER — Other Ambulatory Visit: Payer: Self-pay

## 2020-11-14 DIAGNOSIS — S40862A Insect bite (nonvenomous) of left upper arm, initial encounter: Secondary | ICD-10-CM | POA: Diagnosis present

## 2020-11-14 DIAGNOSIS — W57XXXA Bitten or stung by nonvenomous insect and other nonvenomous arthropods, initial encounter: Secondary | ICD-10-CM | POA: Insufficient documentation

## 2020-11-14 NOTE — ED Triage Notes (Signed)
Pt to ED from home c/o tick bite under left arm.  Pt thinks she got it yesterday.  Pt has embedded tick currently.  Pt denies other symptoms, A&Ox4, in NAD at this time.

## 2020-11-15 ENCOUNTER — Emergency Department
Admission: EM | Admit: 2020-11-15 | Discharge: 2020-11-15 | Disposition: A | Discharge status: Home / Self Care | Payer: No Typology Code available for payment source | Attending: Emergency Medicine | Admitting: Emergency Medicine

## 2020-11-15 DIAGNOSIS — S40862A Insect bite (nonvenomous) of left upper arm, initial encounter: Secondary | ICD-10-CM

## 2020-11-15 NOTE — ED Provider Notes (Signed)
[facility] Emergency Department Provider Note  ____________________________________________   Event Date/Time   First MD Initiated Contact with Patient 11/15/20 0230     (approximate)  I have reviewed the triage vital signs and the nursing notes.   HISTORY  Chief Complaint Tick Removal    HPI Karen Pacheco is a 60 y.o. female with history of mitral valve prolapse who presents to the emergency department with complaints of a tick to her left axilla that she noticed yesterday.  No associated symptoms including fevers, headache, rash, vomiting.  States she is here for tick removal.        Past Medical History:  Diagnosis Date  . Arthritis    knees  . Complication of anesthesia    After knee surgery, felt like she couldn't breathe, SATS were fine.  Marland Kitchen Heart murmur    told in past she has MVP  . Mitral valve prolapse   . Scoliosis    mild  . Wears contact lenses     There are no problems to display for this patient.   Past Surgical History:  Procedure Laterality Date  . CESAREAN SECTION    . KNEE ARTHROSCOPY    . KNEE ARTHROSCOPY Left 02/02/2015   Procedure: ARTHROSCOPY KNEE;  Surgeon: Erin Sons, MD;  Location: [facility];  Service: Orthopedics;  Laterality: Left;    Prior to Admission medications   Medication Sig Start Date End Date Taking? Authorizing Provider  CALCIUM PO Take by mouth.    [provider]  Garlic 10 MG CAPS Take by mouth. Patient not taking: Reported on 03/12/2020    [provider]  Multiple Vitamin (MULTIVITAMIN) capsule Take by mouth.    [provider]  Multiple Vitamins-Minerals (HAIR/SKIN/NAILS PO) Take by mouth.    [provider]  Multiple Vitamins-Minerals (MULTIVITAMIN WITH MINERALS) tablet Take 1 tablet by mouth daily.    [provider]    Allergies No known allergies  Family History  Problem Relation Age of Onset  . Cancer Father   .  Breast cancer Neg Hx     Social History Social History   Tobacco Use  . Smoking status: Never Smoker  . Smokeless tobacco: Never Used  Vaping Use  . Vaping Use: Never used  Substance Use Topics  . Alcohol use: No  . Drug use: No    Review of Systems Constitutional: No fever. Eyes: No visual changes. ENT: No sore throat. Cardiovascular: Denies chest pain. Respiratory: Denies shortness of breath. Gastrointestinal: No nausea, vomiting, diarrhea. Genitourinary: Negative for dysuria. Musculoskeletal: Negative for back pain. Skin: Negative for rash. Neurological: Negative for focal weakness or numbness.  ____________________________________________   PHYSICAL EXAM:  VITAL SIGNS: ED Triage Vitals  Enc Vitals Group     BP 11/14/20 2351 134/90     Pulse Rate 11/14/20 2351 64     Resp 11/14/20 2351 16     Temp 11/14/20 2351 98.5 F (36.9 C)     Temp Source 11/14/20 2351 Oral     SpO2 11/14/20 2351 97 %     Weight 11/14/20 2349 170 lb (77.1 kg)     Height 11/14/20 2349 5\' 4"  (1.626 m)     Head Circumference --      Peak Flow --      Pain Score 11/14/20 2349 0     Pain Loc --      Pain Edu? --      Excl. in GC? --  CONSTITUTIONAL: Alert and oriented and responds appropriately to questions. Well-appearing; well-nourished HEAD: Normocephalic EYES: Conjunctivae clear, pupils appear equal, EOM appear intact ENT: normal nose; moist mucous membranes NECK: Supple, normal ROM CARD: RRR; S1 and S2 appreciated; no murmurs, no clicks, no rubs, no gallops RESP: Normal chest excursion without splinting or tachypnea; breath sounds clear and equal bilaterally; no wheezes, no rhonchi, no rales, no hypoxia or respiratory distress, speaking full sentences ABD/GI: Normal bowel sounds; non-distended; soft, non-tender, no rebound, no guarding, no peritoneal signs, no hepatosplenomegaly BACK: The back appears normal EXT: Normal ROM in all joints; no deformity noted, no edema; no  cyanosis SKIN: Normal color for age and race; warm; no rash on exposed skin; small tick attached to the center of the left axilla with very minimal redness right at the tick bite and no bleeding or drainage NEURO: Moves all extremities equally PSYCH: The patient's mood and manner are appropriate.  ____________________________________________   LABS (all labs ordered are listed, but only abnormal results are displayed)  Labs Reviewed - No data to display ____________________________________________  EKG   ____________________________________________  RADIOLOGY I, Ruhan Borak, personally viewed and evaluated these images (plain radiographs) as part of my medical decision making, as well as reviewing the written report by the radiologist.  ED MD interpretation:    Official radiology report(s): No results found.  ____________________________________________   PROCEDURES  Procedure(s) performed (including Critical Care):  .Foreign Body Removal  Date/Time: 11/15/2020 2:48 AM Performed by: Slade Pierpoint, Layla Maw, DO Authorized by: Koden Hunzeker, Layla Maw, DO  Consent: Verbal consent obtained. Risks and benefits: risks, benefits and alternatives were discussed Consent given by: patient Patient understanding: patient states understanding of the procedure being performed Patient consent: the patient's understanding of the procedure matches consent given Procedure consent: procedure consent matches procedure scheduled Relevant documents: relevant documents present and verified Test results: test results available and properly labeled Site marked: the operative site was marked Imaging studies: imaging studies available Required items: required blood products, implants, devices, and special equipment available Patient identity confirmed: verbally with patient Time out: Immediately prior to procedure a "time out" was called to verify the correct patient, procedure, equipment, support staff and  site/side marked as required. Intake: axilla.  Sedation: Patient sedated: no  Patient restrained: no Complexity: simple 1 objects recovered. Objects recovered: tick Post-procedure assessment: foreign body removed Patient tolerance: patient tolerated the procedure well with no immediate complications    ____________________________________________   INITIAL IMPRESSION / ASSESSMENT AND PLAN / ED COURSE  As part of my medical decision making, I reviewed the following data within the electronic MEDICAL RECORD NUMBER Nursing notes reviewed and incorporated, Old chart reviewed, Notes from prior ED visits         Patient here for tick bite removal.  Was able to easily remove the tick using alcohol swab and forceps.  No remaining pieces of the tick were left in the skin.  No signs of superimposed infection.  She has no symptoms of tickborne illness at this time.  We did discuss return precautions.  Discussed that she does not need prophylactic antibiotics.  Recommended follow-up with her PCP if she does develop symptoms.  Patient verbalized understanding.  At this time, I do not feel there is any life-threatening condition present. I have reviewed, interpreted and discussed all results (EKG, imaging, lab, urine as appropriate) and exam findings with patient/family. I have reviewed nursing notes and appropriate previous records.  I feel the patient is safe to be discharged  home without further emergent workup and can continue workup as an outpatient as needed. Discussed usual and customary return precautions. Patient/family verbalize understanding and are comfortable with this plan.  Outpatient follow-up has been provided as needed. All questions have been answered.  ____________________________________________   FINAL CLINICAL IMPRESSION(S) / ED DIAGNOSES  Final diagnoses:  Tick bite of axillary region, left, initial encounter     ED Discharge Orders    None      *Please note:   HIYAB NHEM was evaluated in Emergency Department on 11/15/2020 for the symptoms described in the history of present illness. She was evaluated in the context of the global COVID-19 pandemic, which necessitated consideration that the patient might be at risk for infection with the SARS-CoV-2 virus that causes COVID-19. Institutional protocols and algorithms that pertain to the evaluation of patients at risk for COVID-19 are in a state of rapid change based on information released by regulatory bodies including the CDC and federal and state organizations. These policies and algorithms were followed during the patient's care in the ED.  Some ED evaluations and interventions may be delayed as a result of limited staffing during and the pandemic.*   Note:  This document was prepared using Dragon voice recognition software and may include unintentional dictation errors.   Djon Tith, Layla Maw, DO 11/15/20 [phone]

## 2020-11-15 NOTE — ED Notes (Signed)
Pt presenting to ED with tick in L armpit. States she thinks she was bitten yesterday, then later felt tick embedding more into skin. States she hadn't tried to remove it because she cannot see it. Denies fever, rash, CP, SOB. AAO NAD VSS

## 2020-11-15 NOTE — Discharge Instructions (Signed)
You may alternate Tylenol 1000 mg every 6 hours as needed for pain, fever and Ibuprofen 800 mg every 8 hours as needed for pain, fever.  Please take Ibuprofen with food.  Do not take more than 4000 mg of Tylenol (acetaminophen) in a 24 hour period.  

## 2021-03-14 ENCOUNTER — Ambulatory Visit (INDEPENDENT_AMBULATORY_CARE_PROVIDER_SITE_OTHER): Payer: No Typology Code available for payment source | Admitting: Obstetrics and Gynecology

## 2021-03-14 ENCOUNTER — Other Ambulatory Visit: Payer: Self-pay

## 2021-03-14 ENCOUNTER — Encounter: Payer: Self-pay | Admitting: Obstetrics and Gynecology

## 2021-03-14 VITALS — BP 110/70 | HR 57 | Ht 65.0 in | Wt 177.0 lb

## 2021-03-14 DIAGNOSIS — Z01419 Encounter for gynecological examination (general) (routine) without abnormal findings: Secondary | ICD-10-CM

## 2021-03-14 DIAGNOSIS — Z1331 Encounter for screening for depression: Secondary | ICD-10-CM | POA: Diagnosis not present

## 2021-03-14 DIAGNOSIS — Z23 Encounter for immunization: Secondary | ICD-10-CM

## 2021-03-14 DIAGNOSIS — Z1339 Encounter for screening examination for other mental health and behavioral disorders: Secondary | ICD-10-CM | POA: Diagnosis not present

## 2021-03-14 NOTE — Progress Notes (Signed)
Routine Annual Gynecology Examination   PCP: Conard Novak, MD  Chief Complaint  Patient presents with   Annual Exam   History of Present Illness: Patient is a 60 y.o. [id number] presents for annual exam. The patient has no complaints today.   Menopausal bleeding: denies  Menopausal symptoms: denies  Breast symptoms: denies  Last pap smear: 2.5  years ago.  Result Normal, HPV negative.  Last mammogram: 01/2020.  Result Normal   Last Colon cancer screen: 01/2020 Cologuard - negative.  She's had palpitations last year. She did see cardiology.  The issue is resolved.    She lost her husband to COVID19 in February last  year.  Past Medical History:  Diagnosis Date   Arthritis    knees   Complication of anesthesia    After knee surgery, felt like she couldn't breathe, SATS were fine.   Heart murmur    told in past she has MVP   Mitral valve prolapse    Scoliosis    mild   Wears contact lenses     Past Surgical History:  Procedure Laterality Date   CESAREAN SECTION     KNEE ARTHROSCOPY     KNEE ARTHROSCOPY Left 02/02/2015   Procedure: ARTHROSCOPY KNEE;  Surgeon: Erin Sons, MD;  Location: [facility];  Service: Orthopedics;  Laterality: Left;    Prior to Admission medications   Medication Sig Start Date End Date Taking? Authorizing Provider  CALCIUM PO Take by mouth.   Yes [provider]  Multiple Vitamin (MULTIVITAMIN) capsule Take by mouth.   Yes [provider]  Multiple Vitamins-Minerals (HAIR/SKIN/NAILS PO) Take by mouth.   Yes [provider]  Multiple Vitamins-Minerals (MULTIVITAMIN WITH MINERALS) tablet Take 1 tablet by mouth daily.   Yes [provider]  Garlic 10 MG CAPS Take by mouth. Patient not taking: Reported on 03/12/2020    [provider]    Allergies  Allergen Reactions   No Known Allergies    Obstetric History: [id number]  Social History   Socioeconomic History   Marital status:  Widowed    Spouse name: Not on file   Number of children: Not on file   Years of education: Not on file   Highest education level: Not on file  Occupational History   Not on file  Tobacco Use   Smoking status: Never   Smokeless tobacco: Never  Vaping Use   Vaping Use: Never used  Substance and Sexual Activity   Alcohol use: No   Drug use: No   Sexual activity: Not Currently  Other Topics Concern   Not on file  Social History Narrative   Not on file   Social Determinants of Health   Financial Resource Strain: Not on file  Food Insecurity: Not on file  Transportation Needs: Not on file  Physical Activity: Not on file  Stress: Not on file  Social Connections: Not on file  Intimate Partner Violence: Not on file    Family History  Problem Relation Age of Onset   Cancer Father    Breast cancer Neg Hx     Review of Systems  Constitutional: Negative.   HENT: Negative.    Eyes: Negative.   Respiratory: Negative.    Cardiovascular:  Negative for chest pain, palpitations, orthopnea, claudication, leg swelling and PND.  Gastrointestinal: Negative.   Genitourinary: Negative.   Musculoskeletal: Negative.   Skin: Negative.   Neurological: Negative.   Psychiatric/Behavioral: Negative.      Physical  Exam Vitals: BP 110/70 (Cuff Size: Normal)   Pulse (!) 57   Ht 5\' 5"  (1.651 m)   Wt 177 lb (80.3 kg)   BMI 29.45 kg/m   Physical Exam Constitutional:      General: She is not in acute distress.    Appearance: Normal appearance. She is well-developed.  Genitourinary:     Vulva and bladder normal.     No vaginal discharge, erythema, tenderness or bleeding.      Right Adnexa: not tender, not full and no mass present.    Left Adnexa: not tender, not full and no mass present.    No cervical motion tenderness, discharge, lesion or polyp.     Uterus is not enlarged or tender.     No uterine mass detected.    Pelvic exam was performed with patient in the lithotomy position.   Breasts:    Right: No inverted nipple, mass, nipple discharge, skin change or tenderness.     Left: No inverted nipple, mass, nipple discharge, skin change or tenderness.  HENT:     Head: Normocephalic and atraumatic.  Eyes:     General: No scleral icterus.    Conjunctiva/sclera: Conjunctivae normal.  Neck:     Thyroid: No thyromegaly.  Cardiovascular:     Rate and Rhythm: Normal rate and regular rhythm.     Heart sounds: No murmur heard.   No friction rub. No gallop.  Pulmonary:     Effort: Pulmonary effort is normal. No respiratory distress.     Breath sounds: Normal breath sounds. No wheezing or rales.  Abdominal:     General: Bowel sounds are normal. There is no distension.     Palpations: Abdomen is soft. There is no mass.     Tenderness: There is no abdominal tenderness. There is no guarding or rebound.  Musculoskeletal:        General: No swelling or tenderness. Normal range of motion.     Cervical back: Normal range of motion and neck supple.  Lymphadenopathy:     Cervical: No cervical adenopathy.     Lower Body: No right inguinal adenopathy. No left inguinal adenopathy.  Neurological:     General: No focal deficit present.     Mental Status: She is alert and oriented to person, place, and time.     Cranial Nerves: No cranial nerve deficit.  Skin:    General: Skin is warm and dry.     Findings: No erythema or rash.  Psychiatric:        Mood and Affect: Mood normal.        Behavior: Behavior normal.        Judgment: Judgment normal.     Female chaperone present for pelvic and breast  portions of the physical exam  Results: AUDIT Questionnaire (screen for alcoholism): 0 PHQ-9: 0   Assessment and Plan:  60 y.o. [id number] female here for routine annual gynecologic examination  Plan: Problem List Items Addressed This Visit   None Visit Diagnoses     Women's annual routine gynecological examination    -  Primary   Screening for depression       Screening for  alcoholism          Screening: -- Blood pressure screen normal -- Colonoscopy -  due. Elects to use Cologuard. Discussed pros/cons  and cost of diagnostic colonoscopy, if positive -- Mammogram - not due -- Weight screening: normal -- Depression screening negative (PHQ-9) -- Nutrition: normal -- cholesterol  screening: not due for screening -- osteoporosis screening: not due -- tobacco screening: not using -- alcohol screening: AUDIT questionnaire indicates low-risk usage. -- family history of breast cancer screening: done. not at high risk. -- no evidence of domestic violence or intimate partner violence. -- STD screening: gonorrhea/chlamydia NAAT not collected per patient request. -- pap smear not collected per ASCCP guidelines  Thomasene Mohair, MD 03/14/2021 4:37 PM

## 2021-03-14 NOTE — Addendum Note (Signed)
Addended by: Kathlene Cote on: 03/14/2021 04:59 PM   Modules accepted: Orders

## 2021-04-10 ENCOUNTER — Emergency Department
Admission: EM | Admit: 2021-04-10 | Discharge: 2021-04-10 | Disposition: A | Discharge status: Home / Self Care | Payer: No Typology Code available for payment source | Attending: Emergency Medicine | Admitting: Emergency Medicine

## 2021-04-10 ENCOUNTER — Encounter: Payer: Self-pay | Admitting: Emergency Medicine

## 2021-04-10 ENCOUNTER — Other Ambulatory Visit: Payer: Self-pay

## 2021-04-10 DIAGNOSIS — R1031 Right lower quadrant pain: Secondary | ICD-10-CM | POA: Diagnosis present

## 2021-04-10 LAB — CBC WITH DIFFERENTIAL/PLATELET
Abs Immature Granulocytes: 0.01 10*3/uL (ref 0.00–0.07)
Basophils Absolute: 0.1 10*3/uL (ref 0.0–0.1)
Basophils Relative: 1 %
Eosinophils Absolute: 0.2 10*3/uL (ref 0.0–0.5)
Eosinophils Relative: 4 %
HCT: 39.8 % (ref 36.0–46.0)
Hemoglobin: 13.4 g/dL (ref 12.0–15.0)
Immature Granulocytes: 0 %
Lymphocytes Relative: 44 %
Lymphs Abs: 2.7 10*3/uL (ref 0.7–4.0)
MCH: 30.7 pg (ref 26.0–34.0)
MCHC: 33.7 g/dL (ref 30.0–36.0)
MCV: 91.1 fL (ref 80.0–100.0)
Monocytes Absolute: 0.9 10*3/uL (ref 0.1–1.0)
Monocytes Relative: 16 %
Neutro Abs: 2.1 10*3/uL (ref 1.7–7.7)
Neutrophils Relative %: 35 %
Platelets: 254 10*3/uL (ref 150–400)
RBC: 4.37 MIL/uL (ref 3.87–5.11)
RDW: 12.8 % (ref 11.5–15.5)
WBC: 5.9 10*3/uL (ref 4.0–10.5)
nRBC: 0 % (ref 0.0–0.2)

## 2021-04-10 LAB — URINALYSIS, COMPLETE (UACMP) WITH MICROSCOPIC
Bacteria, UA: NONE SEEN
Bilirubin Urine: NEGATIVE
Glucose, UA: NEGATIVE mg/dL
Hgb urine dipstick: NEGATIVE
Ketones, ur: NEGATIVE mg/dL
Nitrite: NEGATIVE
Protein, ur: NEGATIVE mg/dL
Specific Gravity, Urine: 1.006 (ref 1.005–1.030)
pH: 5 (ref 5.0–8.0)

## 2021-04-10 LAB — COMPREHENSIVE METABOLIC PANEL
ALT: 15 U/L (ref 0–44)
AST: 19 U/L (ref 15–41)
Albumin: 4 g/dL (ref 3.5–5.0)
Alkaline Phosphatase: 84 U/L (ref 38–126)
Anion gap: 7 (ref 5–15)
BUN: 18 mg/dL (ref 6–20)
CO2: 25 mmol/L (ref 22–32)
Calcium: 9.4 mg/dL (ref 8.9–10.3)
Chloride: 108 mmol/L (ref 98–111)
Creatinine, Ser: 0.79 mg/dL (ref 0.44–1.00)
GFR, Estimated: >60 mL/min (ref >60)
Glucose, Bld: 115 mg/dL — ABNORMAL HIGH (ref 70–99)
Potassium: 3.4 mmol/L — ABNORMAL LOW (ref 3.5–5.1)
Sodium: 140 mmol/L (ref 135–145)
Total Bilirubin: 0.5 mg/dL (ref 0.3–1.2)
Total Protein: 6.6 g/dL (ref 6.5–8.1)

## 2021-04-10 LAB — LIPASE, BLOOD: Lipase: 34 U/L (ref 11–51)

## 2021-04-10 LAB — LACTIC ACID, PLASMA: Lactic Acid, Venous: 1 mmol/L (ref 0.5–1.9)

## 2021-04-10 NOTE — ED Notes (Signed)
E-signature pad unavailable - Pt verbalized understanding of D/C information - no additional concerns at this time.  

## 2021-04-10 NOTE — ED Triage Notes (Signed)
Patient ambulatory to triage with steady gait, without difficulty or distress noted; pt reports rt lower abd pain x 4 days,nonradiating with no accomp symptoms

## 2021-04-10 NOTE — ED Provider Notes (Signed)
[facility] Emergency Department Provider Note   ____________________________________________   Event Date/Time   First MD Initiated Contact with Patient 04/10/21 0302     (approximate)  I have reviewed the triage vital signs and the nursing notes.   HISTORY  Chief Complaint Abdominal Pain    HPI Karen Pacheco is a 60 y.o. female who presents for right lower quadrant abdominal pain  LOCATION: Abdomen, right lower quadrant DURATION: 4 days prior to arrival TIMING: Improved since onset SEVERITY: Moderate QUALITY: Aching CONTEXT: Patient states that after eating a meal 4 days ago she began having intense right lower quadrant gas pains that eventually resolved spontaneously after passing flatus however she states she still feels somewhat of a "tinge" of pain in that area MODIFYING FACTORS: Flexing worsens this pain and is partially relieved with laying flat ASSOCIATED SYMPTOMS: Constipation   Per medical record review, patient has no related past medical history          Past Medical History:  Diagnosis Date   Arthritis    knees   Complication of anesthesia    After knee surgery, felt like she couldn't breathe, SATS were fine.   Heart murmur    told in past she has MVP   Mitral valve prolapse    Scoliosis    mild   Wears contact lenses     There are no problems to display for this patient.   Past Surgical History:  Procedure Laterality Date   CESAREAN SECTION     KNEE ARTHROSCOPY     KNEE ARTHROSCOPY Left 02/02/2015   Procedure: ARTHROSCOPY KNEE;  Surgeon: Erin Sons, MD;  Location: [facility];  Service: Orthopedics;  Laterality: Left;    Prior to Admission medications   Medication Sig Start Date End Date Taking? Authorizing Provider  CALCIUM PO Take by mouth.    [provider]  Garlic 10 MG CAPS Take by mouth.    [provider]  Multiple Vitamin (MULTIVITAMIN) capsule Take by mouth.     [provider]  Multiple Vitamins-Minerals (HAIR/SKIN/NAILS PO) Take by mouth.    [provider]  Multiple Vitamins-Minerals (MULTIVITAMIN WITH MINERALS) tablet Take 1 tablet by mouth daily.    [provider]    Allergies No known allergies  Family History  Problem Relation Age of Onset   Cancer Father    Breast cancer Neg Hx     Social History Social History   Tobacco Use   Smoking status: Never   Smokeless tobacco: Never  Vaping Use   Vaping Use: Never used  Substance Use Topics   Alcohol use: No   Drug use: No    Review of Systems Constitutional: No fever/chills Eyes: No visual changes. ENT: No sore throat. Cardiovascular: Denies chest pain. Respiratory: Denies shortness of breath. Gastrointestinal: Endorses right lower quadrant abdominal pain.  No nausea, no vomiting.  No diarrhea. Genitourinary: Negative for dysuria. Musculoskeletal: Negative for acute arthralgias Skin: Negative for rash. Neurological: Negative for headaches, weakness/numbness/paresthesias in any extremity Psychiatric: Negative for suicidal ideation/homicidal ideation   ____________________________________________   PHYSICAL EXAM:  VITAL SIGNS: ED Triage Vitals  Enc Vitals Group     BP 04/10/21 0302 (!) 150/82     Pulse Rate 04/10/21 0302 (!) 59     Resp 04/10/21 0302 18     Temp 04/10/21 0302 97.8 F (36.6 C)     Temp Source 04/10/21 0302 Oral     SpO2 04/10/21 0302 100 %  Weight 04/10/21 0302 170 lb (77.1 kg)     Height 04/10/21 0302 5\' 5"  (1.651 m)     Head Circumference --      Peak Flow --      Pain Score 04/10/21 0302 4     Pain Loc --      Pain Edu? --      Excl. in GC? --    Constitutional: Alert and oriented. Well appearing and in no acute distress. Eyes: Conjunctivae are normal. PERRL. Head: Atraumatic. Nose: No congestion/rhinnorhea. Mouth/Throat: Mucous membranes are moist. Neck: No stridor Cardiovascular: Grossly normal heart  sounds.  Good peripheral circulation. Respiratory: Normal respiratory effort.  No retractions. Gastrointestinal: Soft and nontender. No distention. Musculoskeletal: No obvious deformities Neurologic:  Normal speech and language. No gross focal neurologic deficits are appreciated. Skin:  Skin is warm and dry. No rash noted. Psychiatric: Mood and affect are normal. Speech and behavior are normal.  ____________________________________________   LABS (all labs ordered are listed, but only abnormal results are displayed)  Labs Reviewed  COMPREHENSIVE METABOLIC PANEL - Abnormal; Notable for the following components:      Result Value   Potassium 3.4 (*)    Glucose, Bld 115 (*)    All other components within normal limits  URINALYSIS, COMPLETE (UACMP) WITH MICROSCOPIC - Abnormal; Notable for the following components:   Color, Urine STRAW (*)    APPearance CLEAR (*)    Leukocytes,Ua SMALL (*)    All other components within normal limits  LACTIC ACID, PLASMA  CBC WITH DIFFERENTIAL/PLATELET  LIPASE, BLOOD   PROCEDURES  Procedure(s) performed (including Critical Care):  Procedures   ____________________________________________   INITIAL IMPRESSION / ASSESSMENT AND PLAN / ED COURSE  As part of my medical decision making, I reviewed the following data within the electronic medical record, if available:  Nursing notes reviewed and incorporated, Labs reviewed, EKG interpreted, Old chart reviewed, Radiograph reviewed and Notes from prior ED visits reviewed and incorporated      Patients symptoms not typical for emergent causes of abdominal pain such as, but not limited to, appendicitis, abdominal aortic aneurysm, surgical biliary disease, pancreatitis, SBO, mesenteric ischemia, serious intra-abdominal bacterial illness. Presentation also not typical of gynecologic emergencies such as TOA, Ovarian Torsion, PID. Not Ectopic. Doubt atypical ACS.  Pt tolerating PO. Disposition: Patient  will be discharged with strict return precautions and follow up with primary MD within 12-24 hours for further evaluation. Patient understands that this still may have an early presentation of an emergent medical condition such as appendicitis that will require a recheck.      ____________________________________________   FINAL CLINICAL IMPRESSION(S) / ED DIAGNOSES  Final diagnoses:  Right lower quadrant abdominal pain     ED Discharge Orders     None        Note:  This document was prepared using Dragon voice recognition software and may include unintentional dictation errors.    07-26-1990, MD 04/10/21 [phone]

## 2021-04-10 NOTE — Discharge Instructions (Addendum)
You may use ibuprofen or naproxen for any continued abdominal pain

## 2021-10-31 ENCOUNTER — Emergency Department
Admission: EM | Admit: 2021-10-31 | Discharge: 2021-10-31 | Disposition: A | Discharge status: Home / Self Care | Payer: No Typology Code available for payment source | Attending: Emergency Medicine | Admitting: Emergency Medicine

## 2021-10-31 ENCOUNTER — Other Ambulatory Visit: Payer: Self-pay

## 2021-10-31 ENCOUNTER — Encounter: Payer: Self-pay | Admitting: *Deleted

## 2021-10-31 ENCOUNTER — Emergency Department: Payer: No Typology Code available for payment source

## 2021-10-31 DIAGNOSIS — Y92410 Unspecified street and highway as the place of occurrence of the external cause: Secondary | ICD-10-CM | POA: Insufficient documentation

## 2021-10-31 DIAGNOSIS — S20229A Contusion of unspecified back wall of thorax, initial encounter: Secondary | ICD-10-CM | POA: Diagnosis not present

## 2021-10-31 DIAGNOSIS — S39012A Strain of muscle, fascia and tendon of lower back, initial encounter: Secondary | ICD-10-CM | POA: Diagnosis not present

## 2021-10-31 DIAGNOSIS — S161XXA Strain of muscle, fascia and tendon at neck level, initial encounter: Secondary | ICD-10-CM | POA: Insufficient documentation

## 2021-10-31 DIAGNOSIS — S3992XA Unspecified injury of lower back, initial encounter: Secondary | ICD-10-CM | POA: Diagnosis present

## 2021-10-31 DIAGNOSIS — S20219A Contusion of unspecified front wall of thorax, initial encounter: Secondary | ICD-10-CM

## 2021-10-31 MED ORDER — METHOCARBAMOL 500 MG PO TABS: 500.0000 mg | ORAL_TABLET | Freq: Four times a day (QID) | ORAL | 0 refills | Status: DC

## 2021-10-31 MED ORDER — MELOXICAM 15 MG PO TABS: 15.0000 mg | ORAL_TABLET | Freq: Every day | ORAL | 0 refills | Status: AC
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
## 2022-06-19 ENCOUNTER — Other Ambulatory Visit: Payer: Self-pay | Admitting: Obstetrics and Gynecology

## 2022-06-19 DIAGNOSIS — Z1231 Encounter for screening mammogram for malignant neoplasm of breast: Secondary | ICD-10-CM

## 2022-06-23 ENCOUNTER — Ambulatory Visit
Admission: RE | Admit: 2022-06-23 | Discharge: 2022-06-23 | Disposition: A | Discharge status: Home / Self Care | Payer: No Typology Code available for payment source | Source: Ambulatory Visit | Attending: Obstetrics and Gynecology | Admitting: Obstetrics and Gynecology

## 2022-06-23 DIAGNOSIS — Z1231 Encounter for screening mammogram for malignant neoplasm of breast: Secondary | ICD-10-CM | POA: Diagnosis present

## 2022-12-08 ENCOUNTER — Emergency Department: Payer: PRIVATE HEALTH INSURANCE

## 2022-12-08 ENCOUNTER — Emergency Department
Admission: EM | Admit: 2022-12-08 | Discharge: 2022-12-08 | Disposition: A | Discharge status: Home / Self Care | Payer: PRIVATE HEALTH INSURANCE | Attending: Emergency Medicine | Admitting: Emergency Medicine

## 2022-12-08 DIAGNOSIS — G44209 Tension-type headache, unspecified, not intractable: Secondary | ICD-10-CM | POA: Insufficient documentation

## 2022-12-08 DIAGNOSIS — R42 Dizziness and giddiness: Secondary | ICD-10-CM | POA: Diagnosis not present

## 2022-12-08 LAB — CBC WITH DIFFERENTIAL/PLATELET
Abs Immature Granulocytes: 0.01 10*3/uL (ref 0.00–0.07)
Basophils Absolute: 0.1 10*3/uL (ref 0.0–0.1)
Basophils Relative: 1 %
Eosinophils Absolute: 0.1 10*3/uL (ref 0.0–0.5)
Eosinophils Relative: 2 %
HCT: 43.1 % (ref 36.0–46.0)
Hemoglobin: 14.3 g/dL (ref 12.0–15.0)
Immature Granulocytes: 0 %
Lymphocytes Relative: 28 %
Lymphs Abs: 1.8 10*3/uL (ref 0.7–4.0)
MCH: 30.1 pg (ref 26.0–34.0)
MCHC: 33.2 g/dL (ref 30.0–36.0)
MCV: 90.7 fL (ref 80.0–100.0)
Monocytes Absolute: 0.5 10*3/uL (ref 0.1–1.0)
Monocytes Relative: 8 %
Neutro Abs: 4.1 10*3/uL (ref 1.7–7.7)
Neutrophils Relative %: 61 %
Platelets: 280 10*3/uL (ref 150–400)
RBC: 4.75 MIL/uL (ref 3.87–5.11)
RDW: 13 % (ref 11.5–15.5)
WBC: 6.5 10*3/uL (ref 4.0–10.5)
nRBC: 0 % (ref 0.0–0.2)

## 2022-12-08 LAB — COMPREHENSIVE METABOLIC PANEL
ALT: 17 U/L (ref 0–44)
AST: 22 U/L (ref 15–41)
Albumin: 4.3 g/dL (ref 3.5–5.0)
Alkaline Phosphatase: 99 U/L (ref 38–126)
Anion gap: 7 (ref 5–15)
BUN: 23 mg/dL — ABNORMAL HIGH (ref 6–20)
CO2: 25 mmol/L (ref 22–32)
Calcium: 9.7 mg/dL (ref 8.9–10.3)
Chloride: 108 mmol/L (ref 98–111)
Creatinine, Ser: 0.72 mg/dL (ref 0.44–1.00)
GFR, Estimated: >60 mL/min (ref >60)
Glucose, Bld: 101 mg/dL — ABNORMAL HIGH (ref 70–99)
Potassium: 3.6 mmol/L (ref 3.5–5.1)
Sodium: 140 mmol/L (ref 135–145)
Total Bilirubin: 0.8 mg/dL (ref 0.3–1.2)
Total Protein: 7.2 g/dL (ref 6.5–8.1)

## 2022-12-08 MED ORDER — MELOXICAM 15 MG PO TABS: 15.0000 mg | ORAL_TABLET | Freq: Every day | ORAL | 0 refills | Status: AC

## 2022-12-08 MED ORDER — MECLIZINE HCL 25 MG PO TABS: 25.0000 mg | ORAL_TABLET | Freq: Three times a day (TID) | ORAL | 0 refills | Status: AC | PRN

## 2022-12-08 MED ORDER — METHOCARBAMOL 500 MG PO TABS: 500.0000 mg | ORAL_TABLET | Freq: Four times a day (QID) | ORAL | 0 refills | Status: AC

## 2022-12-08 NOTE — ED Provider Notes (Signed)
[facility] Provider Note  Patient Contact: 10:23 PM (approximate)   History   Dizziness   HPI  Karen Pacheco is a 62 y.o. female who presents to the emergency department complaining of dizziness.  Patient has had dizziness and vertigo since having COVID several months ago.  Patient has seen ENT who says it has a central problem and referred to neurology.  Patient developed a posterior headache today in addition to her dizziness and presents for evaluation.  No slurred speech, unilateral weakness, vision changes.  No chest pain, shortness of breath.  No medications for vertigo prior to arrival.     Physical Exam   Triage Vital Signs: ED Triage Vitals [12/08/22 1934]  Enc Vitals Group     BP (!) 170/86     Pulse Rate 66     Resp 18     Temp 97.9 F (36.6 C)     Temp Source Oral     SpO2 99 %     Weight      Height      Head Circumference      Peak Flow      Pain Score 0     Pain Loc      Pain Edu?      Excl. in GC?     Most recent vital signs: Vitals:   12/08/22 1934  BP: (!) 170/86  Pulse: 66  Resp: 18  Temp: 97.9 F (36.6 C)  SpO2: 99%     General: Alert and in no acute distress. Eyes:  PERRL. EOMI. Head: No acute traumatic findings ENT:      Ears:       Nose: No congestion/rhinnorhea.      Mouth/Throat: Mucous membranes are moist. Neck: No stridor. No cervical spine tenderness to palpation.  Cardiovascular:  Good peripheral perfusion Respiratory: Normal respiratory effort without tachypnea or retractions. Lungs CTAB. Good air entry to the bases with no decreased or absent breath sounds. Musculoskeletal: Full range of motion to all extremities.  Neurologic:  No gross focal neurologic deficits are appreciated.  Cranial nerves II through XII grossly intact at this time.  Negative Romberg's and pronator drift. Skin:   No rash noted Other:   ED Results / Procedures / Treatments   Labs (all labs ordered are listed, but  only abnormal results are displayed) Labs Reviewed  COMPREHENSIVE METABOLIC PANEL - Abnormal; Notable for the following components:      Result Value   Glucose, Bld 101 (*)    BUN 23 (*)    All other components within normal limits  CBC WITH DIFFERENTIAL/PLATELET     EKG     RADIOLOGY  I personally viewed, evaluated, and interpreted these images as part of my medical decision making, as well as reviewing the written report by the radiologist.  ED Provider Interpretation: No acute intracranial finding on CT scan  CT Head Wo Contrast  Result Date: 12/08/2022 CLINICAL DATA:  Vertigo, central dizziness, headache EXAM: CT HEAD WITHOUT CONTRAST TECHNIQUE: Contiguous axial images were obtained from the base of the skull through the vertex without intravenous contrast. RADIATION DOSE REDUCTION: This exam was performed according to the departmental dose-optimization program which includes automated exposure control, adjustment of the mA and/or kV according to patient size and/or use of iterative reconstruction technique. COMPARISON:  None Available. FINDINGS: Brain: No intracranial hemorrhage, mass effect, or evidence of acute infarct. No hydrocephalus. No extra-axial fluid collection. Vascular: No hyperdense vessel or unexpected calcification.  Skull: No fracture or focal lesion. Sinuses/Orbits: No acute finding. Paranasal sinuses and mastoid air cells are well aerated. Other: None. IMPRESSION: No acute intracranial process. Electronically Signed   By: Minerva Fester M.D.   On: 12/08/2022 22:45    PROCEDURES:  Critical Care performed: No  Procedures   MEDICATIONS ORDERED IN ED: Medications - No data to display   IMPRESSION / MDM / ASSESSMENT AND PLAN / ED COURSE  I reviewed the triage vital signs and the nursing notes.                                 Differential diagnosis includes, but is not limited to, vertigo, tension type headache, migraine, eustachian tube dysfunction, long  COVID syndrome  Patient's presentation is most consistent with acute presentation with potential threat to life or bodily function.   Patient's diagnosis is consistent with vertigo, tension type headache.  Patient presents emergency department with ongoing headaches and vertigo-like symptoms.  Reportedly patient has had the symptoms since having COVID.  She seen ENT and they do not feel that it is an ear problem.  She is currently awaiting evaluation from neurology.  Patient has no slurred speech, unilateral weakness, facial droop or visual changes.  Patient was neurologically intact on exam.  CT scan and labs are reassuring.  Has been ongoing times several months.  Patient states that she feels most of the headache in the back that wraps around to the front.  She states that she frequently finds herself with "bloodshot shoulders."  At this time I recommend symptomatic control for what likely is tension type headache and vertigo symptoms.  She has not tried any medicines in the past for this.  Will prescribe anti-inflammatory, muscle relaxer and meclizine.  I still recommend follow-up with neurology.  Concerning signs and symptoms and return precautions discussed with the patient..  Patient is given ED precautions to return to the ED for any worsening or new symptoms.     FINAL CLINICAL IMPRESSION(S) / ED DIAGNOSES   Final diagnoses:  Vertigo  Acute non intractable tension-type headache     Rx / DC Orders   ED Discharge Orders          Ordered    meclizine (ANTIVERT) 25 MG tablet  3 times daily PRN        12/08/22 2336    meloxicam (MOBIC) 15 MG tablet  Daily        12/08/22 2336    methocarbamol (ROBAXIN) 500 MG tablet  4 times daily        12/08/22 2336             Note:  This document was prepared using Dragon voice recognition software and may include unintentional dictation errors.   Lanette Hampshire 12/08/22 2339    Minna Antis, MD 12/15/22 [phone]

## 2022-12-08 NOTE — ED Triage Notes (Signed)
Ambulatory to triage with c/o vertigo and head pressure x 3 months. Reports sx began after she had Covid, but then resolved, and have been intermittent with multiple episodes since. Advised to see Neurologist by ENT but unable to get appt until end of May.  States it feels like " when your blood pressure is up." Pt denies having hypertension. States is concerned she may have " brain issues or an aneurysm. Denies chest pain or sob. No neuro deficits noted, Denies any fall or head injury

## 2022-12-30 ENCOUNTER — Inpatient Hospital Stay: Payer: PRIVATE HEALTH INSURANCE | Attending: Oncology | Admitting: Oncology

## 2022-12-30 ENCOUNTER — Inpatient Hospital Stay: Payer: PRIVATE HEALTH INSURANCE

## 2022-12-30 ENCOUNTER — Other Ambulatory Visit: Payer: Self-pay

## 2022-12-30 ENCOUNTER — Encounter: Payer: Self-pay | Admitting: Oncology

## 2022-12-30 VITALS — BP 125/83 | HR 56 | Temp 97.3°F | Resp 18 | Wt 170.6 lb

## 2022-12-30 DIAGNOSIS — R42 Dizziness and giddiness: Secondary | ICD-10-CM

## 2022-12-30 DIAGNOSIS — D72819 Decreased white blood cell count, unspecified: Secondary | ICD-10-CM | POA: Diagnosis not present

## 2022-12-30 LAB — CBC WITH DIFFERENTIAL/PLATELET
Abs Immature Granulocytes: 0.02 10*3/uL (ref 0.00–0.07)
Basophils Absolute: 0 10*3/uL (ref 0.0–0.1)
Basophils Relative: 1 %
Eosinophils Absolute: 0.1 10*3/uL (ref 0.0–0.5)
Eosinophils Relative: 3 %
HCT: 42.9 % (ref 36.0–46.0)
Hemoglobin: 14.1 g/dL (ref 12.0–15.0)
Immature Granulocytes: 1 %
Lymphocytes Relative: 32 %
Lymphs Abs: 1.2 10*3/uL (ref 0.7–4.0)
MCH: 30 pg (ref 26.0–34.0)
MCHC: 32.9 g/dL (ref 30.0–36.0)
MCV: 91.3 fL (ref 80.0–100.0)
Monocytes Absolute: 0.5 10*3/uL (ref 0.1–1.0)
Monocytes Relative: 13 %
Neutro Abs: 1.9 10*3/uL (ref 1.7–7.7)
Neutrophils Relative %: 50 %
Platelets: 256 10*3/uL (ref 150–400)
RBC: 4.7 MIL/uL (ref 3.87–5.11)
RDW: 12.9 % (ref 11.5–15.5)
WBC: 3.7 10*3/uL — ABNORMAL LOW (ref 4.0–10.5)
nRBC: 0 % (ref 0.0–0.2)

## 2022-12-30 LAB — RETIC PANEL
Immature Retic Fract: 6.2 % (ref 2.3–15.9)
RBC.: 4.68 MIL/uL (ref 3.87–5.11)
Retic Count, Absolute: 55.7 10*3/uL (ref 19.0–186.0)
Retic Ct Pct: 1.2 % (ref 0.4–3.1)
Reticulocyte Hemoglobin: 32.5 pg (ref >27.9)

## 2022-12-30 LAB — TECHNOLOGIST SMEAR REVIEW
Plt Morphology: NORMAL
RBC MORPHOLOGY: NORMAL
WBC MORPHOLOGY: NORMAL

## 2022-12-30 LAB — FERRITIN: Ferritin: 59 ng/mL (ref 11–307)

## 2022-12-30 LAB — IRON AND TIBC
Iron: 75 ug/dL (ref 28–170)
Saturation Ratios: 19 % (ref 10.4–31.8)
TIBC: 391 ug/dL (ref 250–450)
UIBC: 316 ug/dL

## 2022-12-30 NOTE — Addendum Note (Signed)
Addended by: Coralee Rud on: 12/30/2022 01:00 PM   Modules accepted: Orders

## 2022-12-30 NOTE — Progress Notes (Signed)
Hematology/Oncology Consult note [phone] [phone]      Patient Care Team: Conard Novak, MD as PCP - General (Obstetrics and Gynecology)   REFERRING PROVIDER: Armando Gang, FNP  CHIEF COMPLAINTS/REASON FOR VISIT Iron deficiency   ASSESSMENT & PLAN:  Dizziness Previous labs reviewed and discussed with patient. Patient has normal hemoglobin, iron panel is not consistent with iron deficiency.  Ferritin was not checked. I recommend to check CBC, smear, iron, TIBC ferritin and reticulocyte panel. I do not think dizziness is secondary to underlying iron deficiency if any. I agree with neurology workup for further evaluation.  She has an appointment in May 2024.  Leukopenia Very mild.  Normal differential.   nonspecific finding. Check vitamin B12, folate, flow cytometry,  I discussed with patient that if above workup is negative, she does not need routine follow-up with hematology. She will continue follow-up with primary care provider and neurology for further evaluation.  All questions were answered. The patient knows to call the clinic with any problems, questions or concerns.  Rickard Patience, MD, PhD [facility] Hematology Oncology 12/30/2022     HISTORY OF PRESENTING ILLNESS:  Karen Pacheco is a  62 y.o.  female with PMH listed below who was referred to me for iron deficiency anemia.   Reviewed patient's recent labs that was done.  12/20/2022 12/20/22 Iron saturation 20%, TIBC 411, iron 82 Hemoglobin 14.4, MCV 94.6, bilirubin 0.4, folate 23,   + dizziness/vertigo when standing up since December 2023,  seen by ENT in March 2024  and work up done.  She was recommended by ENT to see neurology work up, appointment is in May 2024  + tense of her shoulders.  + brain pressure.fullness, top of her head, and lower back her head. She takes tylenol, which did not help.  She is concerned about brain tumor or aneurysm. So she went to ER 12/08/22  CT head No acute intracranial process.   She started taking otc iron supplementation 9mg  yesterday.    She denies recent vision changes, chest pain on exertion, shortness of breath on minimal exertion, pre-syncopal episodes, or palpitations She had not noticed any recent bleeding such as epistaxis, hematuria or hematochezia.  Denies weight loss, fever, chills, fatigue, night sweats.   Family history: brother brain tumor s/p resection, father skin myeloma She feels stressed. Life stressor include Mother passed away in 2023/11/24she feels she has been coping well. 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  She feels the stress is exacerbating the " feeling in my head".    MEDICAL HISTORY:  Past Medical History:  Diagnosis Date   Arthritis    knees   Complication of anesthesia    After knee surgery, felt like she couldn't breathe, SATS were fine.   Heart murmur    told in past she has MVP   Mitral valve prolapse    Scoliosis    mild   Wears contact lenses     SURGICAL HISTORY: Past Surgical History:  Procedure Laterality Date   CESAREAN SECTION     KNEE ARTHROSCOPY     KNEE ARTHROSCOPY Left 02/02/2015   Procedure: ARTHROSCOPY KNEE;  Surgeon: Erin Sons, MD;  Location: [facility];  Service: Orthopedics;  Laterality: Left;    SOCIAL HISTORY: Social History   Socioeconomic History   Marital status: Widowed    Spouse name: Not on file   Number of children: Not on file   Years  of education: Not on file   Highest education level: Not on file  Occupational History   Not on file  Tobacco Use   Smoking status: Never   Smokeless tobacco: Never  Vaping Use   Vaping Use: Never used  Substance and Sexual Activity   Alcohol use: No   Drug use: No   Sexual activity: Not Currently  Other Topics Concern   Not on file  Social History Narrative   Not on file   Social Determinants of Health   Financial Resource Strain: Not on file  Food Insecurity:  Not on file  Transportation Needs: Not on file  Physical Activity: Not on file  Stress: Not on file  Social Connections: Not on file  Intimate Partner Violence: Not on file    FAMILY HISTORY: Family History  Problem Relation Age of Onset   Cancer Father    Breast cancer Neg Hx     ALLERGIES:  is allergic to no known allergies.  MEDICATIONS:  Current Outpatient Medications  Medication Sig Dispense Refill   Garlic 10 MG CAPS Take by mouth.     Multiple Vitamins-Iron (ONE-TABLET-DAILY/IRON PO) Take 1 tablet by mouth daily.     Multiple Vitamins-Minerals (HAIR/SKIN/NAILS PO) Take by mouth.     Multiple Vitamins-Minerals (MULTIVITAMIN WITH MINERALS) tablet Take 1 tablet by mouth daily.     vitamin C (ASCORBIC ACID) 250 MG tablet Take 250 mg by mouth daily.     Vitamin D-Vitamin K (VITAMIN K2-VITAMIN D3 PO) Take 1 tablet by mouth daily.     meclizine (ANTIVERT) 25 MG tablet Take 1 tablet (25 mg total) by mouth 3 (three) times daily as needed for dizziness. (Patient not taking: Reported on 12/30/2022) 30 tablet 0   meloxicam (MOBIC) 15 MG tablet Take 1 tablet (15 mg total) by mouth daily. (Patient not taking: Reported on 12/30/2022) 30 tablet 0   methocarbamol (ROBAXIN) 500 MG tablet Take 1 tablet (500 mg total) by mouth 4 (four) times daily. (Patient not taking: Reported on 12/30/2022) 30 tablet 0   Multiple Vitamin (MULTIVITAMIN) capsule Take by mouth. (Patient not taking: Reported on 12/30/2022)     No current facility-administered medications for this visit.    Review of Systems  Constitutional:  Negative for appetite change, chills, fatigue and fever.  HENT:   Negative for hearing loss and voice change.   Eyes:  Negative for eye problems.  Respiratory:  Negative for chest tightness and cough.   Cardiovascular:  Negative for chest pain.  Gastrointestinal:  Negative for abdominal distention, abdominal pain and blood in stool.  Endocrine: Negative for hot flashes.  Genitourinary:   Negative for difficulty urinating and frequency.   Musculoskeletal:  Negative for arthralgias.  Skin:  Negative for itching and rash.  Neurological:  Positive for dizziness and headaches. Negative for extremity weakness.  Hematological:  Negative for adenopathy.  Psychiatric/Behavioral:  Negative for confusion.        Feel stressed.     PHYSICAL EXAMINATION: Vitals:   12/30/22 1103  BP: 125/83  Pulse: (!) 56  Resp: 18  Temp: (!) 97.3 F (36.3 C)  SpO2: 100%   Filed Weights   12/30/22 1103  Weight: 170 lb 9.6 oz (77.4 kg)    Physical Exam Constitutional:      General: She is not in acute distress. HENT:     Head: Normocephalic and atraumatic.  Eyes:     General: No scleral icterus. Cardiovascular:     Rate and Rhythm: Normal  rate and regular rhythm.     Heart sounds: Normal heart sounds.  Pulmonary:     Effort: Pulmonary effort is normal. No respiratory distress.     Breath sounds: No wheezing.  Abdominal:     General: Bowel sounds are normal. There is no distension.     Palpations: Abdomen is soft.  Musculoskeletal:        General: No deformity. Normal range of motion.     Cervical back: Normal range of motion and neck supple.  Skin:    General: Skin is warm and dry.     Findings: No erythema or rash.  Neurological:     Mental Status: She is alert and oriented to person, place, and time. Mental status is at baseline.     Cranial Nerves: No cranial nerve deficit.     Coordination: Coordination normal.      LABORATORY DATA:  I have reviewed the data as listed    Latest Ref Rng & Units 12/08/2022    7:38 PM 04/10/2021    3:29 AM 04/01/2020    1:27 PM  CBC  WBC 4.0 - 10.5 K/uL 6.5  5.9  5.7   Hemoglobin 12.0 - 15.0 g/dL 40.9  81.1  91.4   Hematocrit 36.0 - 46.0 % 43.1  39.8  42.4   Platelets 150 - 400 K/uL 280  254  253       Latest Ref Rng & Units 12/08/2022    7:38 PM 04/10/2021    3:29 AM 04/01/2020    1:27 PM  CMP  Glucose 70 - 99 mg/dL 782  956  213    BUN 6 - 20 mg/dL 23  18  16    Creatinine 0.44 - 1.00 mg/dL 0.86  5.78  4.69   Sodium 135 - 145 mmol/L 140  140  139   Potassium 3.5 - 5.1 mmol/L 3.6  3.4  3.6   Chloride 98 - 111 mmol/L 108  108  103   CO2 22 - 32 mmol/L 25  25  23    Calcium 8.9 - 10.3 mg/dL 9.7  9.4  9.7   Total Protein 6.5 - 8.1 g/dL 7.2  6.6    Total Bilirubin 0.3 - 1.2 mg/dL 0.8  0.5    Alkaline Phos 38 - 126 U/L 99  84    AST 15 - 41 U/L 22  19    ALT 0 - 44 U/L 17  15         RADIOGRAPHIC STUDIES: I have personally reviewed the radiological images as listed and agreed with the findings in the report. CT Head Wo Contrast  Result Date: 12/08/2022 CLINICAL DATA:  Vertigo, central dizziness, headache EXAM: CT HEAD WITHOUT CONTRAST TECHNIQUE: Contiguous axial images were obtained from the base of the skull through the vertex without intravenous contrast. RADIATION DOSE REDUCTION: This exam was performed according to the departmental dose-optimization program which includes automated exposure control, adjustment of the mA and/or kV according to patient size and/or use of iterative reconstruction technique. COMPARISON:  None Available. FINDINGS: Brain: No intracranial hemorrhage, mass effect, or evidence of acute infarct. No hydrocephalus. No extra-axial fluid collection. Vascular: No hyperdense vessel or unexpected calcification. Skull: No fracture or focal lesion. Sinuses/Orbits: No acute finding. Paranasal sinuses and mastoid air cells are well aerated. Other: None. IMPRESSION: No acute intracranial process. Electronically Signed   By: Minerva Fester M.D.   On: 12/08/2022 22:45

## 2022-12-30 NOTE — Assessment & Plan Note (Addendum)
Very mild.  Normal differential.   nonspecific finding. Check vitamin B12, folate, flow cytometry,

## 2022-12-30 NOTE — Assessment & Plan Note (Signed)
Previous labs reviewed and discussed with patient. Patient has normal hemoglobin, iron panel is not consistent with iron deficiency.  Ferritin was not checked. I recommend to check CBC, smear, iron, TIBC ferritin and reticulocyte panel. I do not think dizziness is secondary to underlying iron deficiency if any. I agree with neurology workup for further evaluation.  She has an appointment in May 2024.

## 2023-10-29 ENCOUNTER — Encounter: Payer: Self-pay | Admitting: Dermatology

## 2023-10-29 ENCOUNTER — Ambulatory Visit: Payer: No Typology Code available for payment source | Admitting: Dermatology

## 2023-10-29 DIAGNOSIS — M25449 Effusion, unspecified hand: Secondary | ICD-10-CM | POA: Diagnosis not present

## 2023-10-29 DIAGNOSIS — M67441 Ganglion, right hand: Secondary | ICD-10-CM

## 2023-10-29 DIAGNOSIS — J349 Unspecified disorder of nose and nasal sinuses: Secondary | ICD-10-CM

## 2023-10-29 DIAGNOSIS — L609 Nail disorder, unspecified: Secondary | ICD-10-CM | POA: Diagnosis not present

## 2023-10-29 DIAGNOSIS — I8393 Asymptomatic varicose veins of bilateral lower extremities: Secondary | ICD-10-CM | POA: Diagnosis not present

## 2023-10-29 DIAGNOSIS — I781 Nevus, non-neoplastic: Secondary | ICD-10-CM | POA: Diagnosis not present

## 2023-10-29 DIAGNOSIS — Z7189 Other specified counseling: Secondary | ICD-10-CM | POA: Diagnosis not present

## 2023-10-29 DIAGNOSIS — L603 Nail dystrophy: Secondary | ICD-10-CM

## 2023-10-29 DIAGNOSIS — M67449 Ganglion, unspecified hand: Secondary | ICD-10-CM

## 2023-10-29 DIAGNOSIS — J3489 Other specified disorders of nose and nasal sinuses: Secondary | ICD-10-CM

## 2023-10-29 NOTE — Patient Instructions (Signed)

## 2023-10-29 NOTE — Progress Notes (Signed)
 New Patient Visit   Subjective  Karen Pacheco is a 63 y.o. female who presents for the following: spot at right nose. Present for almost a year, won't heal. Patient has rough spot at inside of nose and possible fungus at left 2nd toe. She has used OTC to treat but seems to be getting worse.   The patient has spots, moles and lesions to be evaluated, some may be new or changing and the patient may have concern these could be cancer.  The following portions of the chart were reviewed this encounter and updated as appropriate: medications, allergies, medical history  Review of Systems:  No other skin or systemic complaints except as noted in HPI or Assessment and Plan.  Objective  Well appearing patient in no apparent distress; mood and affect are within normal limits.   A focused examination was performed of the following areas: Face, foot, toes  Relevant exam findings are noted in the Assessment and Plan.  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  Typically we recommend at least 1-3 treatment sessions about 5-8 weeks apart for best results.  Cannot have tanned skin when BBL performed, and regular use of sunscreen/photoprotection is advised after the procedure to help maintain results. The patient's condition may also require "maintenance treatments" in the future.  The fee for BBL / laser treatments is $350 per treatment session for the whole face.  A fee can be quoted for other parts of the body.  Insurance typically does not pay for BBL/laser treatments and therefore the fee is an out-of-pocket cost. Recommend prophylactic valtrex treatment. Once scheduled  for procedure, will send Rx in prior to patient's appointment.       Patient has area inside nose she is concerned about. Recommend patient be evaluated by ENT.  Will send referral to be evaluated for growths inside nose.  Patient asking about raised knot at hands, no pain. Recommend patient be evaluated by rheumatology.  Will send referral to be evaluated for swelling at finger joints.   Varicose Veins/Spider Veins - Dilated blue, purple or red veins at the lower extremities - Reassured - Smaller vessels can be treated by sclerotherapy (a procedure to inject a medicine into the veins to make them disappear) if desired, but the treatment is not covered by insurance. Larger vessels may be covered if symptomatic and we would refer to vascular surgeon if treatment desired. - Will have Dr. Roseanne Reno review photos to see if patient is a candidate for sclerotherapy              NAIL PROBLEM Left 2nd Toenail Ddx hammer toe 2ndary to pressure  Recommend patient size up in shoe 1/2 size, can be evaluated by podiatrist.  If not improved in 1 year consider doing molecular study DIGITAL MUCOUS CYST Right Dorsal Mid 2nd Finger Ddx early digital mucous cyst  Benign-appearing.  Observation.  Call clinic for new or changing lesions.  Recommend daily use of broad spectrum spf 30+ sunscreen to sun-exposed areas.    Return if symptoms worsen or fail to improve.  Anise Salvo, RMA, am acting as scribe for Armida Sans, MD .   Documentation: I have reviewed the above  documentation for accuracy and completeness, and I agree with the above.  Armida Sans, MD

## 2024-09-08 ENCOUNTER — Other Ambulatory Visit: Payer: Self-pay | Admitting: Family Medicine

## 2024-09-08 DIAGNOSIS — Z1231 Encounter for screening mammogram for malignant neoplasm of breast: Secondary | ICD-10-CM

## 2024-10-06 ENCOUNTER — Encounter
